# Patient Record
Sex: Female | Born: 1955 | Race: Black or African American | Hispanic: No | Marital: Single | Smoking: Current every day smoker
Health system: Southern US, Community
[De-identification: ages and names within clinical notes are randomized; demographics above are authoritative.]

## PROBLEM LIST (undated history)

## (undated) DIAGNOSIS — I10 Essential (primary) hypertension: Secondary | ICD-10-CM

## (undated) DIAGNOSIS — J449 Chronic obstructive pulmonary disease, unspecified: Secondary | ICD-10-CM

---

## 2004-10-14 ENCOUNTER — Emergency Department: Payer: Self-pay | Admitting: Emergency Medicine

## 2009-08-18 ENCOUNTER — Inpatient Hospital Stay: Payer: Self-pay | Admitting: Internal Medicine

## 2012-07-28 ENCOUNTER — Emergency Department: Payer: Self-pay | Admitting: Emergency Medicine

## 2012-07-28 LAB — CBC
HGB: 12.9 g/dL (ref 12.0–16.0)
MCH: 31.1 pg (ref 26.0–34.0)
MCV: 92 fL (ref 80–100)
Platelet: 329 10*3/uL (ref 150–440)
RBC: 4.16 10*6/uL (ref 3.80–5.20)
RDW: 13.2 % (ref 11.5–14.5)
WBC: 8.3 10*3/uL (ref 3.6–11.0)

## 2012-07-28 LAB — COMPREHENSIVE METABOLIC PANEL
Albumin: 3.5 g/dL (ref 3.4–5.0)
Bilirubin,Total: 0.3 mg/dL (ref 0.2–1.0)
Calcium, Total: 9.1 mg/dL (ref 8.5–10.1)
EGFR (African American): >60
Potassium: 3.2 mmol/L — ABNORMAL LOW (ref 3.5–5.1)
SGOT(AST): 32 U/L (ref 15–37)

## 2012-07-28 LAB — URINALYSIS, COMPLETE
Bilirubin,UR: NEGATIVE
Hyaline Cast: 28
Nitrite: NEGATIVE
Protein: 30
RBC,UR: 18 /HPF (ref 0–5)
Squamous Epithelial: 11
WBC UR: 14 /HPF (ref 0–5)

## 2012-07-31 LAB — URINE CULTURE

## 2013-05-14 DIAGNOSIS — I2510 Atherosclerotic heart disease of native coronary artery without angina pectoris: Secondary | ICD-10-CM | POA: Diagnosis present

## 2013-05-16 ENCOUNTER — Inpatient Hospital Stay: Payer: Self-pay | Admitting: Specialist

## 2013-05-16 LAB — COMPREHENSIVE METABOLIC PANEL
Albumin: 3.7 g/dL (ref 3.4–5.0)
BUN: 15 mg/dL (ref 7–18)
Calcium, Total: 9 mg/dL (ref 8.5–10.1)
Chloride: 109 mmol/L — ABNORMAL HIGH (ref 98–107)
Co2: 26 mmol/L (ref 21–32)
EGFR (African American): >60
Osmolality: 279 (ref 275–301)
Potassium: 3.7 mmol/L (ref 3.5–5.1)
SGOT(AST): 27 U/L (ref 15–37)
Sodium: 139 mmol/L (ref 136–145)
Total Protein: 7.9 g/dL (ref 6.4–8.2)

## 2013-05-16 LAB — CBC
HGB: 13.1 g/dL (ref 12.0–16.0)
MCHC: 32.9 g/dL (ref 32.0–36.0)
MCV: 95 fL (ref 80–100)
RBC: 4.23 10*6/uL (ref 3.80–5.20)
WBC: 7.2 10*3/uL (ref 3.6–11.0)

## 2013-05-16 LAB — TROPONIN I
Troponin-I: 0.07 ng/mL — ABNORMAL HIGH
Troponin-I: 0.06 ng/mL — ABNORMAL HIGH

## 2013-05-16 LAB — RAPID INFLUENZA A&B ANTIGENS

## 2013-05-17 LAB — CBC WITH DIFFERENTIAL/PLATELET
Basophil #: 0 10*3/uL (ref 0.0–0.1)
Basophil %: 0.5 %
Eosinophil #: 0 10*3/uL (ref 0.0–0.7)
HCT: 37.6 % (ref 35.0–47.0)
HGB: 12.7 g/dL (ref 12.0–16.0)
Lymphocyte #: 0.5 10*3/uL — ABNORMAL LOW (ref 1.0–3.6)
MCV: 95 fL (ref 80–100)
Monocyte #: 0.1 x10 3/mm — ABNORMAL LOW (ref 0.2–0.9)
Monocyte %: 0.6 %
Neutrophil #: 8.2 10*3/uL — ABNORMAL HIGH (ref 1.4–6.5)
Neutrophil %: 92.4 %
Platelet: 278 10*3/uL (ref 150–440)

## 2013-05-17 LAB — BASIC METABOLIC PANEL
Chloride: 105 mmol/L (ref 98–107)
Co2: 25 mmol/L (ref 21–32)
Potassium: 4.4 mmol/L (ref 3.5–5.1)
Sodium: 138 mmol/L (ref 136–145)

## 2013-05-21 LAB — CULTURE, BLOOD (SINGLE)

## 2013-10-08 DIAGNOSIS — I10 Essential (primary) hypertension: Secondary | ICD-10-CM

## 2013-10-10 DIAGNOSIS — F1490 Cocaine use, unspecified, uncomplicated: Secondary | ICD-10-CM | POA: Diagnosis present

## 2013-12-24 ENCOUNTER — Inpatient Hospital Stay: Payer: Self-pay | Admitting: Internal Medicine

## 2013-12-24 LAB — CBC
HCT: 42 % (ref 35.0–47.0)
HGB: 14.2 g/dL (ref 12.0–16.0)
MCH: 31.7 pg (ref 26.0–34.0)
MCHC: 33.8 g/dL (ref 32.0–36.0)
MCV: 94 fL (ref 80–100)
Platelet: 271 10*3/uL (ref 150–440)
RBC: 4.47 10*6/uL (ref 3.80–5.20)
RDW: 14.1 % (ref 11.5–14.5)
WBC: 4 10*3/uL (ref 3.6–11.0)

## 2013-12-24 LAB — COMPREHENSIVE METABOLIC PANEL
AST: 20 U/L (ref 15–37)
Albumin: 3.8 g/dL (ref 3.4–5.0)
Alkaline Phosphatase: 76 U/L
Anion Gap: 5 — ABNORMAL LOW (ref 7–16)
BUN: 12 mg/dL (ref 7–18)
Bilirubin,Total: 0.3 mg/dL (ref 0.2–1.0)
CALCIUM: 8.7 mg/dL (ref 8.5–10.1)
CHLORIDE: 111 mmol/L — AB (ref 98–107)
Co2: 25 mmol/L (ref 21–32)
Creatinine: 0.9 mg/dL (ref 0.60–1.30)
EGFR (Non-African Amer.): >60
Glucose: 103 mg/dL — ABNORMAL HIGH (ref 65–99)
OSMOLALITY: 281 (ref 275–301)
POTASSIUM: 3.7 mmol/L (ref 3.5–5.1)
SGPT (ALT): 15 U/L (ref 12–78)
SODIUM: 141 mmol/L (ref 136–145)
TOTAL PROTEIN: 7.7 g/dL (ref 6.4–8.2)

## 2013-12-24 LAB — TROPONIN I
TROPONIN-I: 0.07 ng/mL — AB
Troponin-I: 0.07 ng/mL — ABNORMAL HIGH
Troponin-I: 0.08 ng/mL — ABNORMAL HIGH

## 2013-12-24 LAB — LIPID PANEL
Cholesterol: 197 mg/dL (ref 0–200)
HDL: 41 mg/dL (ref 40–60)
Ldl Cholesterol, Calc: 136 mg/dL — ABNORMAL HIGH (ref 0–100)
Triglycerides: 102 mg/dL (ref 0–200)
VLDL CHOLESTEROL, CALC: 20 mg/dL (ref 5–40)

## 2013-12-24 LAB — CK TOTAL AND CKMB (NOT AT ARMC)
CK, Total: 110 U/L
CK-MB: 1.6 ng/mL (ref 0.5–3.6)

## 2013-12-25 LAB — BASIC METABOLIC PANEL
Anion Gap: 8 (ref 7–16)
BUN: 12 mg/dL (ref 7–18)
CREATININE: 0.9 mg/dL (ref 0.60–1.30)
Calcium, Total: 9.4 mg/dL (ref 8.5–10.1)
Chloride: 109 mmol/L — ABNORMAL HIGH (ref 98–107)
Co2: 23 mmol/L (ref 21–32)
EGFR (African American): >60
GLUCOSE: 157 mg/dL — AB (ref 65–99)
Osmolality: 282 (ref 275–301)
Potassium: 3.9 mmol/L (ref 3.5–5.1)
Sodium: 140 mmol/L (ref 136–145)

## 2013-12-25 LAB — CBC WITH DIFFERENTIAL/PLATELET
Basophil #: 0 10*3/uL (ref 0.0–0.1)
Basophil %: 0.7 %
EOS ABS: 0 10*3/uL (ref 0.0–0.7)
Eosinophil %: 0 %
HCT: 39.4 % (ref 35.0–47.0)
HGB: 12.9 g/dL (ref 12.0–16.0)
LYMPHS PCT: 15 %
Lymphocyte #: 0.9 10*3/uL — ABNORMAL LOW (ref 1.0–3.6)
MCH: 31.3 pg (ref 26.0–34.0)
MCHC: 32.7 g/dL (ref 32.0–36.0)
MCV: 96 fL (ref 80–100)
MONOS PCT: 4.8 %
Monocyte #: 0.3 x10 3/mm (ref 0.2–0.9)
Neutrophil #: 4.6 10*3/uL (ref 1.4–6.5)
Neutrophil %: 79.5 %
Platelet: 255 10*3/uL (ref 150–440)
RBC: 4.12 10*6/uL (ref 3.80–5.20)
RDW: 13.8 % (ref 11.5–14.5)
WBC: 5.7 10*3/uL (ref 3.6–11.0)

## 2013-12-25 LAB — HEMOGLOBIN A1C: Hemoglobin A1C: 6 % (ref 4.2–6.3)

## 2013-12-26 ENCOUNTER — Other Ambulatory Visit: Payer: Self-pay | Admitting: Physician Assistant

## 2013-12-26 DIAGNOSIS — R748 Abnormal levels of other serum enzymes: Secondary | ICD-10-CM

## 2013-12-26 DIAGNOSIS — I10 Essential (primary) hypertension: Secondary | ICD-10-CM

## 2013-12-26 DIAGNOSIS — J441 Chronic obstructive pulmonary disease with (acute) exacerbation: Secondary | ICD-10-CM

## 2013-12-29 LAB — PLATELET COUNT: Platelet: 259 10*3/uL (ref 150–440)

## 2014-05-08 ENCOUNTER — Emergency Department: Payer: Self-pay | Admitting: Emergency Medicine

## 2014-05-08 LAB — CBC
HCT: 40.1 % (ref 35.0–47.0)
HGB: 13 g/dL (ref 12.0–16.0)
MCH: 31.2 pg (ref 26.0–34.0)
MCHC: 32.5 g/dL (ref 32.0–36.0)
MCV: 96 fL (ref 80–100)
Platelet: 297 10*3/uL (ref 150–440)
RBC: 4.18 10*6/uL (ref 3.80–5.20)
RDW: 13.2 % (ref 11.5–14.5)
WBC: 6.6 10*3/uL (ref 3.6–11.0)

## 2014-05-08 LAB — URINALYSIS, COMPLETE
BILIRUBIN, UR: NEGATIVE
Bacteria: NONE SEEN
GLUCOSE, UR: NEGATIVE mg/dL (ref 0–75)
Ketone: NEGATIVE
Nitrite: NEGATIVE
Ph: 5 (ref 4.5–8.0)
Protein: NEGATIVE
RBC,UR: 15 /HPF (ref 0–5)
SPECIFIC GRAVITY: 1.03 (ref 1.003–1.030)

## 2014-05-08 LAB — COMPREHENSIVE METABOLIC PANEL
ALBUMIN: 3.6 g/dL (ref 3.4–5.0)
ALK PHOS: 70 U/L
ALT: 16 U/L
ANION GAP: 5 — AB (ref 7–16)
BILIRUBIN TOTAL: 0.2 mg/dL (ref 0.2–1.0)
BUN: 19 mg/dL — ABNORMAL HIGH (ref 7–18)
CHLORIDE: 110 mmol/L — AB (ref 98–107)
CO2: 24 mmol/L (ref 21–32)
CREATININE: 0.92 mg/dL (ref 0.60–1.30)
Calcium, Total: 9 mg/dL (ref 8.5–10.1)
EGFR (African American): >60
EGFR (Non-African Amer.): >60
GLUCOSE: 103 mg/dL — AB (ref 65–99)
Osmolality: 280 (ref 275–301)
Potassium: 3.5 mmol/L (ref 3.5–5.1)
SGOT(AST): 6 U/L — ABNORMAL LOW (ref 15–37)
Sodium: 139 mmol/L (ref 136–145)
TOTAL PROTEIN: 7.3 g/dL (ref 6.4–8.2)

## 2014-05-08 LAB — D-DIMER(ARMC): D-Dimer: 462 ng/ml

## 2014-05-08 LAB — TROPONIN I
TROPONIN-I: 0.06 ng/mL — AB
Troponin-I: 0.05 ng/mL

## 2014-08-07 ENCOUNTER — Ambulatory Visit: Payer: Self-pay | Admitting: Nurse Practitioner

## 2014-09-01 ENCOUNTER — Ambulatory Visit: Payer: Self-pay | Admitting: Family Medicine

## 2014-09-02 ENCOUNTER — Ambulatory Visit: Payer: Self-pay | Admitting: Nurse Practitioner

## 2014-10-02 NOTE — H&P (Signed)
PATIENT NAME:  Kristi Adams, Kristi Adams MR#:  [id number] DATE OF BIRTH:  [birth year]  DATE OF ADMISSION:  05/16/2013  PRIMARY CARE PHYSICIAN: Dr. Angelique HolmEdward Lance.   REQUESTING PHYSICIAN: Dr. Jene Everyobert Kinner.   CHIEF COMPLAINT: Shortness of breath, cough.  HISTORY OF PRESENT ILLNESS: The patient is a 59 year old female with a known history of hypertension and COPD, who is being admitted for acute respiratory failure, likely due to COPD exacerbation. The patient has had a cough and congestion for the last 2 to 3 days associated with wheezing and coughing, She was also using accessory muscles of respiration, could not control at home and decided to come to the Emergency Department. While in the ED, she was hypoxic with room air oxygen saturation of 88% and is being admitted for further evaluation and management. She also complains of headache. Denies any fever. Has some chest tightness. She has ongoing coughing. She was breathing 26 times per minute and is being admitted for further evaluation and management.   PAST MEDICAL HISTORY: COPD with ongoing smoking, hypertension, hyperlipidemia.   ALLERGIES: No known drug allergies.   CURRENT MEDICATIONS AT HOME: 1.  Amlodipine/valsartan 5/160 mg p.o. daily. 2.  Aspirin 81 mg p.o. daily.  3.  Crestor 10 mg p.o. at bedtime.  4.  Nitroglycerin 0.4 mg sublingual every 5 minutes, up to 3 doses as needed.  5.  Proventil 2 puffs inhaled 4 times a day as needed.  6.  Symbicort 2 puffs inhaled twice a day.   SOCIAL HISTORY: Smokes 1 pack of cigarettes daily for the last 40 to 50 years. Occasional alcohol. No IV drugs of abuse.   FAMILY HISTORY: Positive for hypertension.   REVIEW OF SYSTEMS:   CONSTITUTIONAL: No fever. Positive for fatigue and weakness.  EYES: No blurred or double vision.  ENT: No tinnitus or ear pain.  RESPIRATORY: Positive for cough with clear mucus. Wheezing present. Dyspnea, mainly on exertion but also at rest. History of COPD with ongoing  smoking.  CARDIOVASCULAR: Chest tightness from constant coughing. No orthopnea or edema.  GASTROINTESTINAL: No nausea, vomiting, diarrhea.  GENITOURINARY: No dysuria or hematuria. ENDOCRINE: No polyuria or nocturia. HEMATOLOGIC: No anemia or easy bruising. SKIN: No rash or lesion.  MUSCULOSKELETAL: No arthritis or muscle cramp.  NEUROLOGIC: No tingling, numbness, weakness.  PSYCHIATRIC: No history of anxiety or depression.   PHYSICAL EXAMINATION: VITAL SIGNS: Temperature 98.9, heart rate 99 per minute, respiration 26 to 28 per minute, blood pressure 149/83 mmHg. She is saturating 88% on room air and 91% on 2 liters of oxygen via nasal cannula. GENERAL: The patient is a 59 year old female lying in the bed in acute respiratory distress.  EYES: Pupils equal, round, reactive to light and accommodation. No scleral icterus. Extraocular muscles intact.  HENT: Atraumatic, normocephalic. Oropharynx and nasopharynx clear.  NECK: Supple. No jugular venous distention. No thyroid enlargement or tenderness. LUNGS: Wheezing throughout both lungs with prolonged expiration, using accessory muscles of respiration. There is some labored breathing and decreased breath sounds at the bases. No rales or rhonchi.  CARDIOVASCULAR: S1, S2 normal, tachycardic. No murmur, rubs, or gallop.  ABDOMEN: Soft, nontender, nondistended. Bowel sounds present. No organomegaly or masses. EXTREMITIES: No pedal edema, cyanosis, or clubbing.  NEUROLOGIC: Nonfocal examination. Cranial nerves II through XII intact. Muscle strength 5/5 in all extremities. Sensation intact.  PSYCHIATRIC: The patient is alert and oriented x 3.  SKIN: No obvious rash, lesion, ulcer.   LABORATORY, DIAGNOSTIC AND RADIOLOGICAL DATA: Normal BMP. Normal liver function tests. Troponin  of 0.07. Normal CBC. Negative influenza test.   Chest x-ray in the ED showed no acute cardiopulmonary disease.   EKG shows normal sinus rhythm, LVH. She does have some flipped  T waves in lateral leads. No major other ST-T changes.   IMPRESSION AND PLAN: 1.  Acute respiratory failure: Likely due to chronic obstructive pulmonary disease exacerbation. Will start her on IV steroids, antibiotics, nebulizer breathing treatment. Continue Symbicort. Add Spiriva.  2.  Hypertension: Will continue home medication.  3.  Elevated troponin: Likely due to supply-demand ischemia with respiratory failure. Will monitor 2 more sets of troponin. Off-unit telemetry.  4.  Hyperlipidemia: Will continue statin.  5.  Tobacco abuse: She was counseled for about 3 minutes. She does request nicotine patch, which we will order, 21 mg as she smokes about 1 pack of cigarettes daily. She denies any thought of quitting anytime soon.   Total time taking care of this patient: 55 minutes.   ____________________________ Ellamae Sia. Sherryll Burger, MD vss:jcm D: 05/16/2013 20:29:12 ET T: 05/16/2013 21:18:38 ET JOB#: [id number]  cc: Lenette Rau S. Sherryll Burger, MD, <Dictator> Dr. Angelique Holm Ellamae Sia [facility] MD ELECTRONICALLY SIGNED 05/19/2013 15:28

## 2014-10-02 NOTE — Discharge Summary (Signed)
PATIENT NAME:  Kristi Adams, Kristi Adams MR#:  [id number] DATE OF BIRTH:  01-09-1956  DATE OF ADMISSION:  05/16/2013 DATE OF DISCHARGE:  05/20/2013  For a detailed note, please take a look at the history and physical on admission by Dr. Delfino LovettVipul Shah.   DIAGNOSES AT DISCHARGE ARE AS FOLLOWS: Acute respiratory failure, likely secondary to chronic obstructive pulmonary disease exacerbation; chronic obstructive pulmonary disease exacerbation likely secondary to ongoing tobacco abuse, hypertension, tobacco abuse.   DISCHARGE DIET:  The patient is being discharged on a low-sodium diet.   DISCHARGE ACTIVITY: As tolerated.   DISCHARGE FOLLOWUP: With Dr. Lorin PicketEd Lance in the next 1 to 2 weeks.   DISCHARGE MEDICATIONS:  Aspirin 81 mg daily, amlodipine/valsartan 5/160 one tab daily, nitroglycerin sublingual as needed, Crestor 10 mg at bedtime, Symbicort 2 puffs b.i.d., albuterol inhaler 2 puffs 4 times daily as needed, calcium with vitamin D 2000 mg daily, Spiriva 1 puff daily, Levaquin 5 mg daily x 5 days and a prednisone taper starting at 50 mg down to 10 mg over the next 10 days.   PERTINENT STUDIES DONE DURING THE HOSPITAL COURSE: Are as follows: A chest x-ray done on admission showing no acute cardiopulmonary disease. A repeat chest x-ray done on December 6th showing no evidence of pneumonia or CHF.    HOSPITAL COURSE: This is a 59 year old female with medical problems as mentioned above, presented to the hospital with shortness of breath, cough and wheezing and noted to be in acute respiratory failure, likely secondary to COPD exacerbation.  1.  Acute respiratory failure. This was likely secondary to COPD exacerbation. The patient was treated aggressively with IV steroids, around-the-clock nebulizer treatments, also maintained on her Spiriva and Symbicort. She was also given Pulmicort nebs and started on empiric antibiotics. After aggressive therapy, the patient's clinical symptoms have significantly improved. She was  also ambulated on room air, did not desaturate below 91%; therefore, did not qualify for home oxygen. She currently is being discharged on a long prednisone taper along with empiric Levaquin as stated, with maintenance of her inhalers. She was also strongly advised from quitting smoking.  2.  Hypertension. The patient remained hemodynamically stable on her Norvasc and valsartan and she will resume that.  3.  Elevated troponin. The patient had a mildly elevated troponin at 0.06. This was likely related to demand ischemia from hypoxemia and respiratory failure. There was no evidence of acute coronary syndrome. She is currently chest pain-free and hemodynamically stable.  4.  Hyperlipidemia. The patient was maintained on her Crestor. She will resume that.  5.  Tobacco abuse. The patient was maintained on nicotine patch and strongly advised to quit smoking.   The patient is a full code.   TIME SPENT: 40 minutes.   ____________________________ Rolly PancakeVivek J. Cherlynn KaiserSainani, MD vjs:cs D: 05/20/2013 15:06:00 ET T: 05/20/2013 15:50:58 ET JOB#: [id number]  cc: Rolly PancakeVivek J. Cherlynn KaiserSainani, MD, <Dictator> Angelique HolmEdward Lance Houston SirenVIVEK J SAINANI MD ELECTRONICALLY SIGNED 05/22/2013 14:32

## 2014-10-03 NOTE — Discharge Summary (Signed)
PATIENT NAME:  Kristi Adams, Ravneet G MR#:  [id number] DATE OF BIRTH:  1955-10-24  DATE OF ADMISSION:  12/24/2013 DATE OF DISCHARGE:  12/29/2013  DISCHARGE DIAGNOSES:  1. Acute respiratory failure.  2. Chronic obstructive pulmonary disease exacerbation.  3. Hypertension.  4. Tobacco abuse.  5. Coronary artery disease.  6. Elevated troponin, mild secondary to demand ischemia from chronic obstructive pulmonary disease.   IMAGING STUDIES: Chest x-ray, PA and lateral, which showed no acute infiltrates but did suggest bronchitis.   The echocardiogram showed ejection fraction of 75% with normal systolic function. No significant valvular abnormalities.   ADMITTING HISTORY AND PHYSICAL: Please see detailed H and P dictated by Dr. Elisabeth PigeonVachhani. In brief, a 59 year old African American female patient with history of hypertension, hyperlipidemia, CAD, and COPD who continues to smoke, presented to the Emergency Room complaining of shortness of breath and cough. The patient was found to have significant wheezing needing oxygen to keep her saturations up and was admitted to the hospitalist service.   HOSPITAL COURSE:  1. Acute respiratory failure secondary to COPD exacerbation. The patient was started on IV steroids, scheduled nebulizers, and oxygen support. She did improve well, was also on Levaquin during the hospital stay. The patient by the day of discharge does have wheezing and shortness of breath on exertion, but has not desatted on ambulation. The patient has requested that she be discharged home. She mentions that she always has shortness of breath and wheezing and feels like she is back to baseline and would be fine at home. I have counseled her to quit smoking on multiple occasions during the stay in the hospital. Presently, she is not needing any oxygen with saturations of 94% on ambulation on room air.  2. Elevated troponin very minimal with 0.07, 0.08, and 0.07. She had an echocardiogram which showed no  wall motion abnormalities. She did not have any chest pain.  3. Hypertension, stable during the hospital stay.  4. Prior to discharge, the patient has good air entry on both sides but has expiratory wheezes. No use of accessory muscles. Heart sounds S1, S2 without any murmurs. No edema.   DISCHARGE MEDICATIONS:  1. Prednisone 60 mg tapered over 12 days.  2. Aspirin 81 mg daily.  3. Isosorbide mononitrate 30 mg oral once a day.  4. Crestor 10 mg oral once a day.  5. Exforge 5/160 mg oral once a day.  6. Spiriva 18 mcg inhaled once a day.  7. Symbicort 160/4.5 mg 2 puffs inhaled 2 times a day.  8. Cholecalciferol 2000 international units weekly.  9. Proventil 2 puffs inhaled every 4 hours as needed for shortness of breath or wheezing.  10. Tussionex 5 mg oral every 12 hours as needed for cough.  11. Nicotine 14 mg transdermal patch once a day.  12. DuoNeb 3 mL inhaled 4 times a day.  13. Levaquin 5 mg oral once a day for 5 days.   DISCHARGE INSTRUCTIONS: Low-sodium diet. Activity as tolerated. Follow up with primary care physician within a week. The patient has been counseled to quit smoking.   TIME SPENT ON DAY OF DISCHARGE IN DISCHARGE ACTIVITY: 40 minutes.   ____________________________ Molinda BailiffSrikar R. Messi Twedt, MD srs:lt D: 12/29/2013 12:57:44 ET T: 12/29/2013 21:47:42 ET JOB#: [id number]  cc: Wardell HeathSrikar R. Elpidio AnisSudini, MD, <Dictator> Angelique HolmEdward Lance, MD Orie FishermanSRIKAR R Latiya Navia MD ELECTRONICALLY SIGNED 12/31/2013 17:23

## 2014-10-03 NOTE — H&P (Signed)
PATIENT NAME:  Kristi Adams, Kristi Adams MR#:  [id number] DATE OF BIRTH:  Mar 06, 1956  DATE OF ADMISSION:  12/24/2013  PRIMARY CARE PHYSICIAN: With [facility].  REFERRING EMERGENCY ROOM PHYSICIAN: Dr. Onalee Huaavid.   CHIEF COMPLAINT: Shortness of breath and cough.   HISTORY OF PRESENT ILLNESS: A 59 year old female with past history of COPD, with ongoing smoking, hypertension, hyperlipidemia and coronary artery disease, suggested to have medical management. For the last 2 days, she has cough and shortness of breath, so decided to come to Emergency Room. On further questioning, she said that she does not have sputum production or fever, but feels lightheaded and has started feeling chest pain in lower chest, bilateral, with constant coughing. She called [facility], and while she was on the phone, they heard severe coughing and wheezing, so they said to go to Emergency Room instead of coming over there, as they might also send her to Emergency Room directly. The pain in the lower chest she described as sharp, but mild, 4 to 5 out of 10, and only with coughing. 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   REVIEW OF SYSTEMS:  CONSTITUTIONAL: Negative for fever, fatigue, weakness, pain or weight loss.  EYES: No blurry or double vision, discharge or redness.  ENT: No tinnitus, ear pain or hearing loss.  RESPIRATORY: The patient has cough and wheezing. No hemoptysis, but has shortness of breath.  CARDIOVASCULAR: No chest pain, orthopnea, edema, arrhythmia, palpitation.  GASTROINTESTINAL: No nausea, vomiting, diarrhea, abdominal pain.  GENITOURINARY: No dysuria, hematuria or increased frequency.  ENDOCRINE: No heat or cold intolerance. No excessive sweating.  SKIN: No acne, rashes or lesions.  MUSCULOSKELETAL: No pain or  swelling in the joints.  NEUROLOGICAL: No numbness, weakness, tremor or vertigo.  PSYCHIATRIC: No anxiety, insomnia, bipolar disorder.   PAST MEDICAL HISTORY:  1. COPD with ongoing smoking.  2. Hypertension.  3. Hyperlipidemia.  4. Coronary artery disease, found having 2 blocked arteries, but was not big enough to do stenting, so suggested to have medications.   SOCIAL HISTORY: She smoked 1 pack of cigarettes daily for the last 40 to 50 years. She tried quitting 2 to 3 times, but was not successful, picked up again due to stress. Denies alcohol or illegal drug use. She lives with her son. 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   FAMILY HISTORY: Positive for hypertension.   HOME MEDICATIONS: Spiriva 18 mcg inhalation once a day and aspirin enteric-coated 81 mg once a day, but she is currently not taking for the last 2 months.   PHYSICAL EXAMINATION: VITAL SIGNS: In ER, temperature 98.2, pulse is 93, respirations 24, blood pressure 158/88, pulse oximetry is 91 on room air.  GENERAL: The patient is fully alert and oriented to time, place and person. Does not appear in any acute distress.  HEENT: Head and neck atraumatic. Conjunctivae pink. Oral mucosa moist.  NECK: Supple. No JVD.  RESPIRATORY: Bilateral equal air entry present, but expiratory wheezing present.  CARDIOVASCULAR: S1, S2 present, regular. No murmur.  ABDOMEN: Soft, nontender. Bowel sounds present. No organomegaly.  SKIN: No rashes.  LEGS: No edema.  NEUROLOGICAL: Power 5 out of 5. Moves all 4 limbs. No tremor or  rigidity. No numbness. Follows commands.  PSYCHIATRIC: Does not appear in any acute psychiatric illness.  JOINTS: No swelling or tenderness.   IMPORTANT LABORATORY RESULTS:  Glucose 103, BUN 12, creatinine 0.9, sodium 141, potassium is 3.7, chloride 111, CO2 25, calcium 8.7.  Troponin 0.07.  Total protein 7.7, albumin 3.8,  bilirubin 0.3, alkaline phosphatase 76, SGOT 20, SGPT 15. WBC 4, hemoglobin 14.2, platelets 271 and MCV is 94. Chest x-ray, PA and lateral: New central airway thickening and prominence of bronchovascular markings, suggestive of bronchitis. No evidence of pneumonia. EKG: Sinus rhythm, shows some T wave inversion in lateral chest leads, but which is old compared to previous EKG.   ASSESSMENT AND PLAN: A 59 year old female with history of chronic obstructive pulmonary disease, current smoking, hypertension, hyperlipidemia and coronary artery disease, suggested medical management, who came to Emergency Room with cough and shortness of breath for the last 2 days and did not improve enough after getting multiple breathing treatments in ER.   1. Chronic obstructive pulmonary disease exacerbation. Will give her IV Solu-Medrol, Spiriva, DuoNeb and antibiotics, Rocephin and azithromycin, and give oxygen as-needed basis. As she was not able to afford her medication because of financial reasons, will get case management to come and work with her. I also had a discussion with her about $4 plan medications, and she appreciates that help and information.  2. Hypertension. Currently, during my examination, blood pressure 134/74, so she does not need any medication at this time, but we might need to start on some generic medication if it goes up.  3. Hyperlipidemia. She was not taking any medication for that. Will check lipid panel and start on medication if needed.  4. Coronary artery disease- with elevated troponin. Started on aspirin. She should be taking statin, we are checking a lipid panel, and beta blocker if needed for her high blood pressure.    troponin is likley due to COPD exacerbation- will folow serial and keep on tele monitoring. 5. Smoking. Smoking cessation counseling is done for 5 minutes, and she agreed to try nicotine patch.   CODE STATUS: Full code.   TOTAL TIME SPENT ON THIS ADMISSION: 50  minutes.   ____________________________ Kristi Adams Elisabeth Pigeon, MD vgv:lb D: 12/24/2013 12:34:31 ET T: 12/24/2013 12:56:29 ET JOB#: [id number]  cc: Kristi Adams. Elisabeth Pigeon, MD, <Dictator> Altamese Dilling MD ELECTRONICALLY SIGNED 12/25/2013 12:58

## 2014-10-03 NOTE — Consult Note (Signed)
General Aspect Primary Cardiologist: [facility]  59 y/o F with h/o COPD with on going tobacco use, HTN, hyperlipidemia, CAD (followed by [facility]), cath 04/21/2013 with non obstructive disease who developed cough, SOB, lightheadedness, and associated chest pain with coughing for 2 days. Her initial troponin was 0.07->0.07->0.08 on 12/24/2013. EKG with NSR, 100, old TWI V2-V6. We are consulted for elevated troponin in patient with respiratory distress.   ________________________________________  Cath 04/21/13: The left main (LMCA)  is a large-caliber  short vessel  that originatesfrom the left coronary  sinus. It bifurcates  into the  left anteriordescending (LAD)  and left circumflex  (LCx) arteries.  There is noangiographic evidence  of significant  disease in the  LMCA.The LAD is a large-caliber  vessel  that gives off one  medium-sizeddiagonal (D) branch  before it wraps  around the apex.  There are mildluminal irregularities  in the mid  LAD.The LCx is a large-caliber  vessel  that gives off two  obtuse marginal(OM) branches and  then continues as  a small vessel  in the AV groove.OM1 is a large bifurcated  vessel.  OM2 is a medium caliber  vessel.There is no angiographic  evidence  of significant disease  in the LCx.The right coronary  artery (RCA) is  a large-caliber  vesseloriginating from  the right coronary  sinus. It continues  distally into the posterior  descending artery  (PDA) consistent  with a rightdominant system.  There is no angiographic  evidence  of significant disease in the RCA.   Present Illness 59 y/o F with the above problem list presented to [facility] 12/24/2013 with 2 day history of coughing, SOB, lightheadedness, and chest pain associated with her coughing. She does have a reported history of nonobstructive CAD from a cath done at [facility] 04/21/2013 (see above for full report from [facility].) She reports "2 blockages" but no stents. Medical therapy was advised. She was started on Imdur 30 mg daily, crestor  69m, asa 81 mg, exforge 5-160 mg, and given SL Nitro rx. She has done well on these medications and been without chest pain since this episode in November. However, she does have significant SOB/DOE and deconditiong at baseline, stating she must take frequent breaks with ambulation 2/2 to SOB. She does not develop chest pain associated with this exertion.   [facility], as she has smoked 1 ppd for 40-50 yrs. Over the past 2 days its has gotten worse. Cough is associated with chest pain was along the bilateral interior portion of her rib cage and at times persisted after she was done coughing. Pain sharp and rated a 5/10. Baseline SOB and DOE. Not taking her spiriva.  Echo was done on 12/26/2013 revealing: 1. Left ventricular ejection fraction, by visual estimation, is >75%.  2. Normal global left ventricular systolic function. 3. Mildly dilated left atrium.   Physical Exam:  GEN well developed, well nourished, no acute distress   HEENT PERRL   NECK supple   RESP normal resp effort  wheezing   CARD Regular rate and rhythm  No murmur   ABD denies tenderness  soft  normal BS   EXTR negative edema   SKIN normal to palpation   NEURO cranial nerves intact   PSYCH alert, A+O to time, place, person, good insight   Review of Systems:  General: No Complaints   Skin: No Complaints   ENT: No Complaints   Eyes: No Complaints  Neck: No Complaints   Respiratory: Frequent cough  Short of breath  Wheezing   Cardiovascular: Chest pain or discomfort  Tightness  Dyspnea   Gastrointestinal: No Complaints   Genitourinary: No Complaints   Vascular: No Complaints   Musculoskeletal: No Complaints   Neurologic: No Complaints   Hematologic: No Complaints   Endocrine: No Complaints   Psychiatric: No Complaints   Review of Systems: All other systems were reviewed and found to be negative   Medications/Allergies Reviewed  Medications/Allergies reviewed   Family & Social History:  Family and Social History:  Family History Coronary Artery Disease   Social History positive  tobacco, negative ETOH, negative Illicit drugs, Continues to smoke 1 ppd x 40-50 years. Has tried to quit.   Place of Living Home  Lives in [place]. Unemployed.     COPD:    uti:    URI:    pneumonia:    asthma:    Bronchitis:   Home Medications: Medication Instructions Status  Spiriva 18 mcg inhalation capsule 1 each inhaled once a day Active  Aspirin Enteric Coated 81 mg oral delayed release tablet 1 tab(s) orally once a day Active   Lab Results:  LabObservation:  17-Jul-15 09:00   OBSERVATION Reason for Test  Hepatic:  15-Jul-15 09:17   Bilirubin, Total 0.3  Alkaline Phosphatase 76 (45-117 NOTE: New Reference Range 05/02/13)  SGPT (ALT) 15  SGOT (AST) 20  Total Protein, Serum 7.7  Albumin, Serum 3.8  Cardiology:  15-Jul-15 09:04   Ventricular Rate 100  Atrial Rate 100  P-R Interval 152  QRS Duration 72  QT 370  QTc 477  P Axis 81  R Axis 60  T Axis 81  ECG interpretation Normal sinus rhythm Biatrial enlargement Left ventricular hypertrophy with repolarization abnormality Cannot rule out Septal infarct , age undetermined Abnormal ECG When compared with ECG of 16-May-2013 18:32, No significant change was found ----------unconfirmed---------- Confirmed by OVERREAD, NOT ([id number]), editor PEARSON, BARBARA ([id number]) on 12/25/2013 8:18:29 AM  17-Jul-15 09:00   Echo Doppler REASON FOR EXAM:     COMMENTS:     PROCEDURE: [facility] - ECHO DOPPLER COMPLETE(TRANSTHOR)  - Dec 26 2013  9:00AM   RESULT: Echocardiogram Report  Patient Name:   Kristi Adams Date of Exam: 12/26/2013 Medical Rec #:  [id number]          Custom1: Date of Birth:  1955/10/09        Height:       59.0 in Patient Age:    59 years        Weight:       176.0 lb Patient Gender: F               BSA:          1.75 m??  Indications: SOB Sonographer:     Janalee Dane RCS Referring Phys: Vaughan Basta  Summary:  1. Left ventricular ejection fraction, by visual estimation, is >75%.  2. Normal global left ventricular systolic function.  3. Mildly dilated left atrium. 2D AND M-MODE MEASUREMENTS (normal ranges within parentheses): Left Ventricle:          Normal IVSd (2D):      1.10 cm (0.7-1.1) LVPWd (2D):     1.05 cm (0.7-1.1) Aorta/LA:                  Normal LVIDd (2D):     4.51 cm (3.4-5.7) Aortic Root (2D): 3.20 cm (2.4-3.7) LVIDs (2D):  2.24 cm           Left Atrium (2D): 4.20 cm (1.9-4.0) LV FS (2D):     50.3 %   (>25%) LV EF (2D):     81.8 %   (>50%)                                   Right Ventricle:                                   RVd (2D):        0.16 cm LV DIASTOLIC FUNCTION: MV Peak E: 0.75 m/s E/e' Ratio: 13.00 MV Peak A: 0.90 m/s Decel Time: 327 msec E/A Ratio: 0.84 SPECTRAL DOPPLER ANALYSIS (where applicable): Mitral Valve: MV P1/2 Time: 94.83 msec MV Area, PHT: 2.32 cm??  PHYSICIAN INTERPRETATION: Left Ventricle: The left ventricular internal cavity size was normal. LV  septal wall thickness was normal. LV posterior wall thickness was normal.  Global LV systolic function was normal. Left ventricular ejection  fraction, by visual estimation, is >75%. Right Ventricle: The right ventricular size is normal. Global RV systolic  function is normal. Left Atrium: The left atrium is mildly dilated. Mitral Valve: The mitral valve is not well seen. Tricuspid Valve: The tricuspid valve is not well seen. Aortic Valve: The aortic valve was not well seen. No evidence of aortic  valve regurgitation is seen.  [place] MD Electronically signed by [id number] Bill MD Signature Date/Time: 12/26/2013/11:40:21 AM  *** Final ***  IMPRESSION: .  Verified By: Teodoro Spray, M.D., MD  Routine Chem:  15-Jul-15 09:17   Glucose, Serum  103  BUN 12  Creatinine (comp) 0.90  Sodium, Serum 141  Potassium,  Serum 3.7  Chloride, Serum  111  CO2, Serum 25  Calcium (Total), Serum 8.7  Anion Gap  5  Osmolality (calc) 281  eGFR (African American) >60  eGFR (Non-African American) >60 (eGFR values <60m/min/1.73 m2 may be an indication of chronic kidney disease (CKD). Calculated eGFR is useful in patients with stable renal function. The eGFR calculation will not be reliable in acutely ill patients when serum creatinine is changing rapidly. It is not useful in  patients on dialysis. The eGFR calculation may not be applicable to patients at the low and high extremes of body sizes, pregnant women, and vegetarians.)  Result Comment TROPONIN - RESULTS VERIFIED BY REPEAT TESTING.  - C/KATHY COX AT 1021 12/24/13-DAS  - READ-BACK PROCESS PERFORMED.  Result(s) reported on 24 Dec 2013 at 10:25AM.  Cholesterol, Serum 197  Triglycerides, Serum 102  HDL (INHOUSE) 41  VLDL Cholesterol Calculated 20  LDL Cholesterol Calculated  136 (Result(s) reported on 24 Dec 2013 at 01:15PM.)    13:36   Result Comment TROPONIN - RESULTS VERIFIED BY REPEAT TESTING.  - RESULT CALLED PREVIOUSLY AT 1021  - 12/24/13 BY DAS  Result(s) reported on 24 Dec 2013 at 02:37PM.    17:23   Result Comment TROPONIN - PREVIOUSLY CALLED TO KATHY COX AT 1021  - ON 12/24/13 BY DAS. [place] Result(s) reported on 24 Dec 2013 at 06:15PM.  16-Jul-15 04:39   Glucose, Serum  157  BUN 12  Creatinine (comp) 0.90  Sodium, Serum 140  Potassium, Serum 3.9  Chloride, Serum  109  CO2, Serum 23  Calcium (Total), Serum 9.4  Anion Gap  8  Osmolality (calc) 282  eGFR (African American) >60  eGFR (Non-African American) >60 (eGFR values <41m/min/1.73 m2 may be an indication of chronic kidney disease (CKD). Calculated eGFR is useful in patients with stable renal function. The eGFR calculation will not be reliable in acutely ill patients when serum creatinine is changing rapidly. It is not useful in  patients on dialysis. The eGFR calculation may not  be applicable to patients at the low and high extremes of body sizes, pregnant women, and vegetarians.)  Hemoglobin A1c ([facility]) 6.0 (The American Diabetes Association recommends that a primary goal of therapy should be <7% and that physicians should reevaluate the treatment regimen in patients with HbA1c values consistently >8%.)  Cardiac:  15-Jul-15 09:17   Troponin I  0.07 (0.00-0.05 0.05 ng/mL or less: NEGATIVE  Repeat testing in 3-6 hrs  if clinically indicated. >0.05 ng/mL: POTENTIAL  MYOCARDIAL INJURY. Repeat  testing in 3-6 hrs if  clinically indicated. NOTE: An increase or decrease  of 30% or more on serial  testing suggests a  clinically important change)  CK, Total 110 (26-192 NOTE: NEW REFERENCE RANGE  07/14/2013)  CPK-MB, Serum 1.6 (Result(s) reported on 24 Dec 2013 at 10:17AM.)    13:36   Troponin I  0.07 (0.00-0.05 0.05 ng/mL or less: NEGATIVE  Repeat testing in 3-6 hrs  if clinically indicated. >0.05 ng/mL: POTENTIAL  MYOCARDIAL INJURY. Repeat  testing in 3-6 hrs if  clinically indicated. NOTE: An increase or decrease  of 30% or more on serial  testing suggests a  clinically important change)    17:23   Troponin I  0.08 (0.00-0.05 0.05 ng/mL or less: NEGATIVE  Repeat testing in 3-6 hrs  if clinically indicated. >0.05 ng/mL: POTENTIAL  MYOCARDIAL INJURY. Repeat  testing in 3-6 hrs if  clinically indicated. NOTE: An increase or decrease  of 30% or more on serial  testing suggests a  clinically important change)  Routine Hem:  15-Jul-15 09:17   WBC (CBC) 4.0  RBC (CBC) 4.47  Hemoglobin (CBC) 14.2  Hematocrit (CBC) 42.0  Platelet Count (CBC) 271 (Result(s) reported on 24 Dec 2013 at 10:22AM.)  MCV 94  MCH 31.7  MCHC 33.8  RDW 14.1  16-Jul-15 04:39   WBC (CBC) 5.7  RBC (CBC) 4.12  Hemoglobin (CBC) 12.9  Hematocrit (CBC) 39.4  Platelet Count (CBC) 255  MCV 96  MCH 31.3  MCHC 32.7  RDW 13.8  Neutrophil % 79.5  Lymphocyte % 15.0  Monocyte  % 4.8  Eosinophil % 0.0  Basophil % 0.7  Neutrophil # 4.6  Lymphocyte #  0.9  Monocyte # 0.3  Eosinophil # 0.0  Basophil # 0.0 (Result(s) reported on 25 Dec 2013 at 05:07AM.)   EKG:  EKG Interp. by me   Interpretation NSR, 100, old TWI V2-V6.   Radiology Results: XRay:    15-Jul-15 09:49, Chest PA and Lateral  Chest PA and Lateral   REASON FOR EXAM:    Shortness of Breath  COMMENTS:   May transport without cardiac monitor    PROCEDURE: DXR - DXR CHEST PA (OR AP) AND LATERAL  - Dec 24 2013  9:49AM     CLINICAL DATA:  Shortness of breath. Coughing with chest pain.  History of smoking and asthma.    EXAM:  CHEST  2 VIEW    COMPARISON:  05/17/2013 and 05/16/2013.    FINDINGS:  The heart size and mediastinal contours are stable. The pulmonary  vascularity remains normal, although there is  new increased  prominence of the bronchovascular markings, likely representing  bronchitis. There is no focal airspace disease or pleural effusion.  The osseous structures appear stable.     IMPRESSION:  New central airway thickening and prominence of the bronchovascular  markings suggestingbronchitis. No evidence of pneumonia.      Electronically Signed    By: Camie Patience M.D.    On: 12/24/2013 09:53       Verified By: Vivia Ewing, M.D.,  Cardiology:    17-Jul-15 09:00, Echo Doppler  Echo Doppler   REASON FOR EXAM:      COMMENTS:       PROCEDURE: [facility] - ECHO DOPPLER COMPLETE(TRANSTHOR)  - Dec 26 2013  9:00AM     RESULT: Echocardiogram Report    Patient Name:   Kristi Adams Date of Exam: 12/26/2013  Medical Rec #:  [id number]          Custom1:  Date of Birth:  05-Jan-1956        Height:       59.0 in  Patient Age:    53 years        Weight:       176.0 lb  Patient Gender: F               BSA:          1.75 m??    Indications: SOB  Sonographer:    Janalee Dane RCS  Referring Phys: Vaughan Basta    Summary:   1. Left ventricular ejection fraction, by  visual estimation, is >75%.   2. Normal global left ventricular systolic function.   3. Mildly dilated left atrium.  2D AND M-MODE MEASUREMENTS (normal ranges within parentheses):  Left Ventricle:          Normal  IVSd (2D):      1.10 cm (0.7-1.1)  LVPWd (2D):     1.05 cm (0.7-1.1) Aorta/LA:                  Normal  LVIDd (2D):     4.51 cm (3.4-5.7) Aortic Root (2D): 3.20 cm (2.4-3.7)  LVIDs (2D):     2.24 cm           Left Atrium (2D): 4.20 cm (1.9-4.0)  LV FS (2D):     50.3 %   (>25%)  LV EF (2D):     81.8 %   (>50%)                                    Right Ventricle:                                    RVd (2D):        6.38 cm  LV DIASTOLIC FUNCTION:  MV Peak E: 0.75 m/s E/e' Ratio: 13.00  MV Peak A: 0.90 m/s Decel Time: 327 msec  E/A Ratio: 0.84  SPECTRAL DOPPLER ANALYSIS (where applicable):  Mitral Valve:  MV P1/2 Time: 94.83 msec  MV Area, PHT: 2.32 cm??    PHYSICIAN INTERPRETATION:  Left Ventricle: The left ventricular internal cavity size was normal. LV   septal wall thickness was normal. LV posterior wall thickness was normal.   Global LV systolic function was normal. Left ventricular ejection   fraction, by visual estimation, is >75%.  Right Ventricle:  The right ventricular size is normal. Global RV systolic   function is normal.  Left Atrium: The left atrium is mildly dilated.  Mitral Valve: The mitral valve is not well seen.  Tricuspid Valve: The tricuspid valve is not well seen.  Aortic Valve: The aortic valve was not well seen. No evidence of aortic   valve regurgitation is seen.    [place] MD  Electronically signed by [id number] Bill MD  Signature Date/Time: 12/26/2013/11:40:21 AM    *** Final ***    IMPRESSION: .    Verified By: Teodoro Spray, M.D., MD    No Known Allergies:   Vital Signs/Nurse's Notes: **Vital Signs.:   17-Jul-15 15:23  Vital Signs Type Routine  Temperature Temperature (F) 98.1  Celsius 36.7  Pulse Pulse 89   Respirations Respirations 20  Systolic BP Systolic BP 122  Diastolic BP (mmHg) Diastolic BP (mmHg) 76  Mean BP 103  Pulse Ox % Pulse Ox % 97  Pulse Ox Activity Level  At rest  Oxygen Delivery 2L  *Intake and Output.:   17-Jul-15 12:00  Grand Totals Intake:  240 Output:      Net:  240 20 Hr.:  480  Oral Intake      In:  240    12:00  Percentage of Meal Eaten  100    Shift 15:00  Grand Totals Intake:  480 Output:      Net:  480 47 Hr.:  480  Oral Intake      In:  480  Length of Stay Totals Intake:  1110 Output:  300    Net:  40    Impression 59 y/o F with COPD with on going tobacco use, HTN, hyperlipidemia, CAD (followed by [facility]), cath 04/21/2013 with non obstructive disease who developed cough, SOB, lightheadedness, and associated chest pain with coughing for 2 days and subsequent elevated troponin of 0.07->0.07->0.08 in the setting of respiratory distress.  1. Elevated troponin: She has mildly elevated troponin of 0.07->0.07->0.08 on 12/24/2013. Her echo today revealed EF >75%, normal LV systolic function, and a mildly dilated left atrium. This is likely demand ischemia in the setting of her COPD exacerbation. She underwent a cath at [facility] 04/21/2013 that revealed nonobstructive disease. See detailed report in my note above.Continue medical management. Lipid pending. Must stop smoking.   2. Hypertension: Controlled. Consider bb.   3. Copd exacerbation: abx, nebs, steroids per IM.   4. Tobacco abuse: Must stop smoking.   Electronic Signatures for Addendum Section:  Kathlyn Sacramento (MD) (Signed Addendum 17-Jul-15 19:32)  The patient was seen and examined. Agree with the above. She presented with COPD exacerbation and was found to have mildly elevated TnI. No ischemic ECG changes. Echo showed normal EF and wall motion. Previous cath at [facility] in 04/2013 showed no significant CAD.  Elevated TnI is likely due to supply demand ischemia. No further cardiac work up is recommended.    Electronic Signatures: Kathlyn Sacramento (MD)  (Signed 17-Jul-15 19:32)  Co-Signer: General Aspect/Present Illness, Home Medications, Allergies Idolina Primer, Hortensia Duffin M (PA-C)  (Signed 17-Jul-15 16:49)  Authored: General Aspect/Present Illness, History and Physical Exam, Review of System, Family & Social History, Past Medical History, Home Medications, Labs, EKG , Radiology, Allergies, Vital Signs/Nurse's Notes, Impression/Plan   Last Updated: 17-Jul-15 19:32 by Kathlyn Sacramento (MD)

## 2015-05-06 DIAGNOSIS — J449 Chronic obstructive pulmonary disease, unspecified: Secondary | ICD-10-CM | POA: Diagnosis present

## 2016-08-08 ENCOUNTER — Emergency Department
Admission: EM | Admit: 2016-08-08 | Discharge: 2016-08-09 | Disposition: A | Discharge status: Home / Self Care | Payer: Medicare Other | Attending: Emergency Medicine | Admitting: Emergency Medicine

## 2016-08-08 ENCOUNTER — Encounter: Payer: Self-pay | Admitting: Emergency Medicine

## 2016-08-08 ENCOUNTER — Emergency Department: Payer: Medicare Other

## 2016-08-08 DIAGNOSIS — Z79899 Other long term (current) drug therapy: Secondary | ICD-10-CM | POA: Insufficient documentation

## 2016-08-08 DIAGNOSIS — J449 Chronic obstructive pulmonary disease, unspecified: Secondary | ICD-10-CM | POA: Insufficient documentation

## 2016-08-08 DIAGNOSIS — J069 Acute upper respiratory infection, unspecified: Secondary | ICD-10-CM

## 2016-08-08 DIAGNOSIS — J40 Bronchitis, not specified as acute or chronic: Secondary | ICD-10-CM | POA: Diagnosis not present

## 2016-08-08 DIAGNOSIS — R05 Cough: Secondary | ICD-10-CM | POA: Diagnosis present

## 2016-08-08 DIAGNOSIS — B9789 Other viral agents as the cause of diseases classified elsewhere: Secondary | ICD-10-CM

## 2016-08-08 DIAGNOSIS — I10 Essential (primary) hypertension: Secondary | ICD-10-CM | POA: Diagnosis not present

## 2016-08-08 DIAGNOSIS — F17210 Nicotine dependence, cigarettes, uncomplicated: Secondary | ICD-10-CM | POA: Insufficient documentation

## 2016-08-08 DIAGNOSIS — Z7982 Long term (current) use of aspirin: Secondary | ICD-10-CM | POA: Insufficient documentation

## 2016-08-08 DIAGNOSIS — J42 Unspecified chronic bronchitis: Secondary | ICD-10-CM

## 2016-08-08 HISTORY — DX: Essential (primary) hypertension: I10

## 2016-08-08 HISTORY — DX: Chronic obstructive pulmonary disease, unspecified: J44.9

## 2016-08-08 LAB — BASIC METABOLIC PANEL
ANION GAP: 5 (ref 5–15)
BUN: 15 mg/dL (ref 6–20)
CALCIUM: 9.2 mg/dL (ref 8.9–10.3)
CO2: 26 mmol/L (ref 22–32)
Chloride: 107 mmol/L (ref 101–111)
Creatinine, Ser: 0.88 mg/dL (ref 0.44–1.00)
GLUCOSE: 106 mg/dL — AB (ref 65–99)
POTASSIUM: 3.6 mmol/L (ref 3.5–5.1)
Sodium: 138 mmol/L (ref 135–145)

## 2016-08-08 LAB — CBC
HCT: 40.1 % (ref 35.0–47.0)
Hemoglobin: 13.9 g/dL (ref 12.0–16.0)
MCH: 32.1 pg (ref 26.0–34.0)
MCHC: 34.7 g/dL (ref 32.0–36.0)
MCV: 92.4 fL (ref 80.0–100.0)
PLATELETS: 257 10*3/uL (ref 150–440)
RBC: 4.34 MIL/uL (ref 3.80–5.20)
RDW: 13.4 % (ref 11.5–14.5)
WBC: 10.9 10*3/uL (ref 3.6–11.0)

## 2016-08-08 LAB — TROPONIN I: Troponin I: 0.03 ng/mL (ref <0.03)

## 2016-08-08 NOTE — ED Triage Notes (Addendum)
Pt to triage via w/c with no distress noted; st since yesterday not feeling well with chills, prod cough, sinus congestion, and frontal HA, SHOB, dizziness

## 2016-08-09 DIAGNOSIS — J069 Acute upper respiratory infection, unspecified: Secondary | ICD-10-CM | POA: Diagnosis not present

## 2016-08-09 LAB — TROPONIN I: Troponin I: 0.03 ng/mL (ref <0.03)

## 2016-08-09 MED ORDER — IPRATROPIUM-ALBUTEROL 0.5-2.5 (3) MG/3ML IN SOLN
3.0000 mL | Freq: Once | RESPIRATORY_TRACT | Status: AC
  Administered 2016-08-09: 3 mL via RESPIRATORY_TRACT
  Filled 2016-08-09: qty 3

## 2016-08-09 MED ORDER — PREDNISONE 20 MG PO TABS: ORAL_TABLET | ORAL | 0 refills | Status: DC

## 2016-08-09 MED ORDER — SODIUM CHLORIDE 0.9 % IV BOLUS (SEPSIS)
500.0000 mL | Freq: Once | INTRAVENOUS | Status: AC
  Administered 2016-08-09: 500 mL via INTRAVENOUS

## 2016-08-09 MED ORDER — KETOROLAC TROMETHAMINE 30 MG/ML IJ SOLN
10.0000 mg | Freq: Once | INTRAMUSCULAR | Status: AC
  Administered 2016-08-09: 9.9 mg via INTRAVENOUS
  Filled 2016-08-09: qty 1

## 2016-08-09 MED ORDER — PREDNISONE 20 MG PO TABS
60.0000 mg | ORAL_TABLET | Freq: Once | ORAL | Status: AC
  Administered 2016-08-09: 60 mg via ORAL
  Filled 2016-08-09: qty 3

## 2016-08-09 NOTE — ED Notes (Signed)
Lab called for troponin results

## 2016-08-09 NOTE — ED Provider Notes (Signed)
[facility] Emergency Department Provider Note   ____________________________________________   First MD Initiated Contact with Patient 08/09/16 0002     (approximate)  I have reviewed the triage vital signs and the nursing notes.   HISTORY  Chief Complaint Cough; Nasal Congestion; Headache; and Shortness of Breath    HPI Kristi Adams is a 61 y.o. female who presents to the ED from home with a chief complaint of flulike symptoms. Patient reports a three-day history of generalized body aches, chills, cough productive of yellow sputum, sinus congestion, frontal headache, shortness of breath and dizziness. Denies fever, chest pain, abdominal pain, nausea, vomiting, diarrhea. Denies recent travel or trauma. Nothing makes her symptoms better or worse.   Past Medical History:  Diagnosis Date  . COPD (chronic obstructive pulmonary disease) (HCC)   . Hypertension     There are no active problems to display for this patient.   History reviewed. No pertinent surgical history.  Prior to Admission medications   Medication Sig Start Date End Date Taking? Authorizing Provider  albuterol (PROVENTIL HFA;VENTOLIN HFA) 108 (90 Base) MCG/ACT inhaler Inhale 1-2 puffs into the lungs every 6 (six) hours as needed for wheezing or shortness of breath.   Yes Historical Provider, MD  aspirin EC 81 MG tablet Take 81 mg by mouth daily.   Yes Historical Provider, MD  budesonide-formoterol (SYMBICORT) 160-4.5 MCG/ACT inhaler Inhale 2 puffs into the lungs 2 (two) times daily.   Yes Historical Provider, MD  isosorbide mononitrate (IMDUR) 30 MG 24 hr tablet Take 30 mg by mouth daily.   Yes Historical Provider, MD  losartan (COZAAR) 100 MG tablet Take 100 mg by mouth daily.   Yes Historical Provider, MD  metoprolol tartrate (LOPRESSOR) 25 MG tablet Take 25 mg by mouth 2 (two) times daily.   Yes Historical Provider, MD  omeprazole (PRILOSEC) 40 MG capsule Take 40 mg by mouth  daily.   Yes Historical Provider, MD  rosuvastatin (CRESTOR) 10 MG tablet Take 10 mg by mouth every evening.   Yes Historical Provider, MD  tiotropium (SPIRIVA) 18 MCG inhalation capsule Place 18 mcg into inhaler and inhale daily.   Yes Historical Provider, MD  predniSONE (DELTASONE) 20 MG tablet 3 tablets daily x 4 days 08/09/16   Irean Hong, MD    Allergies Patient has no known allergies.  No family history on file.  Social History Social History  Substance Use Topics  . Smoking status: Current Every Day Smoker    Packs/day: 1.00    Types: Cigarettes  . Smokeless tobacco: Never Used  . Alcohol use No    Review of Systems  Constitutional: Positive for chills and body aches. Eyes: No visual changes. ENT: No sore throat. Cardiovascular: Denies chest pain. Respiratory: Positive for productive cough and shortness of breath. Gastrointestinal: No abdominal pain.  No nausea, no vomiting.  No diarrhea.  No constipation. Genitourinary: Negative for dysuria. Musculoskeletal: Negative for back pain. Skin: Negative for rash. Neurological: Negative for headaches, focal weakness or numbness.  10-point ROS otherwise negative.  ____________________________________________   PHYSICAL EXAM:  VITAL SIGNS: ED Triage Vitals  Enc Vitals Group     BP 08/08/16 2303 125/77     Pulse Rate 08/08/16 2303 92     Resp 08/08/16 2303 (!) 26     Temp 08/08/16 2303 100 F (37.8 C)     Temp Source 08/08/16 2303 Oral     SpO2 08/08/16 2303 92 %     Weight  08/08/16 2305 172 lb (78 kg)     Height 08/08/16 2305 5' (1.524 m)     Head Circumference --      Peak Flow --      Pain Score 08/08/16 2303 6     Pain Loc --      Pain Edu? --      Excl. in GC? --     Constitutional: Alert and oriented. Well appearing and in no acute distress. Eyes: Conjunctivae are normal. PERRL. EOMI. Head: Atraumatic. Nose: Congestion/rhinnorhea. Mouth/Throat: Mucous membranes are moist.  Oropharynx  non-erythematous. Neck: No stridor.  Supple neck without meningismus. Cardiovascular: Normal rate, regular rhythm. Grossly normal heart sounds.  Good peripheral circulation. Respiratory: Normal respiratory effort.  No retractions. Lungs with scattered rhonchi and slight wheezing. Gastrointestinal: Soft and nontender. No distention. No abdominal bruits. No CVA tenderness. Musculoskeletal: No lower extremity tenderness nor edema.  No joint effusions. Neurologic:  Normal speech and language. No gross focal neurologic deficits are appreciated. No gait instability. Skin:  Skin is warm, dry and intact. No rash noted. No petechiae. Psychiatric: Mood and affect are normal. Speech and behavior are normal.  ____________________________________________   LABS (all labs ordered are listed, but only abnormal results are displayed)  Labs Reviewed  BASIC METABOLIC PANEL - Abnormal; Notable for the following:       Result Value   Glucose, Bld 106 (*)    All other components within normal limits  TROPONIN I - Abnormal; Notable for the following:    Troponin I 0.03 (*)    All other components within normal limits  TROPONIN I - Abnormal; Notable for the following:    Troponin I 0.03 (*)    All other components within normal limits  CBC   ____________________________________________  EKG  ED ECG REPORT I, Betsey Sossamon J, the attending physician, personally viewed and interpreted this ECG.   Date: 08/09/2016  EKG Time: 2306  Rate: 93  Rhythm: normal EKG, normal sinus rhythm  Axis: Normal  Intervals:none  ST&T Change: T-wave inversion inferior lateral leads; new T-wave inversion in inferior leads compared to November 2015  ____________________________________________  RADIOLOGY  Chest 2 view interpreted per Dr. Sterling Big: Mild increase in interstitial prominence consistent with bronchitic  change. Aortic atherosclerosis. No pneumonic consolidations nor CHF.    ____________________________________________   PROCEDURES  Procedure(s) performed: None  Procedures  Critical Care performed: No  ____________________________________________   INITIAL IMPRESSION / ASSESSMENT AND PLAN / ED COURSE  Pertinent labs & imaging results that were available during my care of the patient were reviewed by me and considered in my medical decision making (see chart for details).  61 year old female with a history of COPD presenting with flulike symptoms. Will initiate judicious IV fluids (patient reports history of CHF), NSAIDs, nebulizer treatment and reassess.  Clinical Course as of Aug 09 508  Wed Aug 09, 2016  0326 Patient sleeping in no acute distress. Repeat troponin unchanged. Room air saturations 93-96%. Wheezing improved. Discussed with patient; will place on 5 day prednisone burst and she will follow-up with her PCP closely. Strict return precautions given. Patient verbalizes understanding and agrees with plan of care.  [JS]    Clinical Course User Index [JS] Irean Hong, MD     ____________________________________________   FINAL CLINICAL IMPRESSION(S) / ED DIAGNOSES  Final diagnoses:  Viral URI with cough  Chronic bronchitis, unspecified chronic bronchitis type (HCC)      NEW MEDICATIONS STARTED DURING THIS VISIT:  New Prescriptions  PREDNISONE (DELTASONE) 20 MG TABLET    3 tablets daily x 4 days     Note:  This document was prepared using Dragon voice recognition software and may include unintentional dictation errors.    Irean HongJade J Sherida Dobkins, MD 08/09/16 [phone]

## 2016-08-09 NOTE — Discharge Instructions (Signed)
1. Finish prednisone 60 mg daily 4 days. Start your next dose on Thursday morning.  2. Continue your inhalers and nebulizers every 4 hours as needed for wheezing or difficulty breathing. 3. Return to the ER for worsening symptoms, persistent vomiting, difficulty breathing or other concerns.

## 2016-08-09 NOTE — ED Notes (Signed)
Lab reports troponin value of 0.03

## 2016-12-12 ENCOUNTER — Other Ambulatory Visit: Payer: Self-pay | Admitting: Family Medicine

## 2016-12-12 DIAGNOSIS — Z1231 Encounter for screening mammogram for malignant neoplasm of breast: Secondary | ICD-10-CM

## 2017-01-16 ENCOUNTER — Telehealth: Payer: Self-pay | Admitting: *Deleted

## 2017-01-16 NOTE — Telephone Encounter (Signed)
Received referral for low dose lung cancer screening CT scan.  Attempted to leave message at phone number listed in EMR for patient to call me back to facilitate scheduling scan. However, this option is not available. Will attempt at later date.  

## 2017-01-22 ENCOUNTER — Inpatient Hospital Stay: Admission: RE | Admit: 2017-01-22 | Payer: Medicare Other | Source: Ambulatory Visit

## 2017-01-24 ENCOUNTER — Ambulatory Visit: Payer: Medicare Other

## 2017-01-30 ENCOUNTER — Ambulatory Visit
Admission: RE | Admit: 2017-01-30 | Discharge: 2017-01-30 | Disposition: A | Discharge status: Home / Self Care | Payer: Medicare Other | Source: Ambulatory Visit | Attending: Family Medicine | Admitting: Family Medicine

## 2017-01-30 DIAGNOSIS — Z1231 Encounter for screening mammogram for malignant neoplasm of breast: Secondary | ICD-10-CM | POA: Insufficient documentation

## 2017-02-02 ENCOUNTER — Encounter: Payer: Self-pay | Admitting: *Deleted

## 2017-02-22 ENCOUNTER — Telehealth: Payer: Self-pay | Admitting: *Deleted

## 2017-02-22 DIAGNOSIS — Z122 Encounter for screening for malignant neoplasm of respiratory organs: Secondary | ICD-10-CM

## 2017-02-22 DIAGNOSIS — Z87891 Personal history of nicotine dependence: Secondary | ICD-10-CM

## 2017-02-22 NOTE — Telephone Encounter (Signed)
Received referral for initial lung cancer screening scan. Contacted patient and obtained smoking history,(current, 40 pack year) as well as answering questions related to screening process. Patient denies signs of lung cancer such as weight loss or hemoptysis. Patient denies comorbidity that would prevent curative treatment if lung cancer were found. Patient is scheduled for shared decision making visit and CT scan on 03/06/17.  

## 2017-02-22 NOTE — Telephone Encounter (Signed)
error 

## 2017-03-06 ENCOUNTER — Encounter: Payer: Self-pay | Admitting: Oncology

## 2017-03-06 ENCOUNTER — Inpatient Hospital Stay: Payer: Medicare Other | Attending: Oncology | Admitting: Oncology

## 2017-03-06 ENCOUNTER — Ambulatory Visit
Admission: RE | Admit: 2017-03-06 | Discharge: 2017-03-06 | Disposition: A | Discharge status: Home / Self Care | Payer: Medicare Other | Source: Ambulatory Visit | Attending: Oncology | Admitting: Oncology

## 2017-03-06 DIAGNOSIS — Z87891 Personal history of nicotine dependence: Secondary | ICD-10-CM | POA: Diagnosis not present

## 2017-03-06 DIAGNOSIS — I2510 Atherosclerotic heart disease of native coronary artery without angina pectoris: Secondary | ICD-10-CM | POA: Diagnosis not present

## 2017-03-06 DIAGNOSIS — J439 Emphysema, unspecified: Secondary | ICD-10-CM | POA: Insufficient documentation

## 2017-03-06 DIAGNOSIS — Z720 Tobacco use: Secondary | ICD-10-CM | POA: Insufficient documentation

## 2017-03-06 DIAGNOSIS — I700 Atherosclerosis of aorta: Secondary | ICD-10-CM | POA: Insufficient documentation

## 2017-03-06 DIAGNOSIS — Z122 Encounter for screening for malignant neoplasm of respiratory organs: Secondary | ICD-10-CM

## 2017-03-06 DIAGNOSIS — F17210 Nicotine dependence, cigarettes, uncomplicated: Secondary | ICD-10-CM | POA: Diagnosis not present

## 2017-03-06 DIAGNOSIS — R918 Other nonspecific abnormal finding of lung field: Secondary | ICD-10-CM | POA: Insufficient documentation

## 2017-03-06 NOTE — Progress Notes (Signed)
In accordance with CMS guidelines, patient has met eligibility criteria including age, absence of signs or symptoms of lung cancer.  Social History  Substance Use Topics  . Smoking status: Current Every Day Smoker    Packs/day: 1.00    Years: 40.00    Types: Cigarettes  . Smokeless tobacco: Never Used  . Alcohol use No     A shared decision-making session was conducted prior to the performance of CT scan. This includes one or more decision aids, includes benefits and harms of screening, follow-up diagnostic testing, over-diagnosis, false positive rate, and total radiation exposure.  Counseling on the importance of adherence to annual lung cancer LDCT screening, impact of co-morbidities, and ability or willingness to undergo diagnosis and treatment is imperative for compliance of the program.  Counseling on the importance of continued smoking cessation for former smokers; the importance of smoking cessation for current smokers, and information about tobacco cessation interventions have been given to patient including [place] and [employer] quit [place] programs.  Written order for lung cancer screening with LDCT has been given to the patient and any and all questions have been answered to the best of my abilities.   Yearly follow up will be coordinated by Burgess Estelle, Thoracic Navigator.  Beckey Rutter, NP 03/06/17 2:51 PM

## 2017-03-09 ENCOUNTER — Telehealth: Payer: Self-pay | Admitting: *Deleted

## 2017-03-09 NOTE — Telephone Encounter (Signed)
Notified patient of LDCT lung cancer screening program results with recommendation for 6 month follow up imaging. Also notified of incidental findings noted below and is encouraged to discuss further with PCP who will receive a copy of this note and/or the CT report. Patient verbalizes understanding.   IMPRESSION: 1. Lung-RADS 3S, probably benign findings. Short-term follow-up in 6 months is recommended with repeat low-dose chest CT without contrast (please use the following order, "CT CHEST LCS NODULE FOLLOW-UP W/O CM"). 2. The "S" modifier above refers to potentially clinically significant non lung cancer related findings. Specifically, there is aortic atherosclerosis, in addition to 2 vessel coronary artery disease. Please note that although the presence of coronary artery calcium documents the presence of coronary artery disease, the severity of this disease and any potential stenosis cannot be assessed on this non-gated CT examination. Assessment for potential risk factor modification, dietary therapy or pharmacologic therapy may be warranted, if clinically indicated. 3. Mild diffuse bronchial wall thickening with mild centrilobular and paraseptal emphysema; imaging findings suggestive of underlying COPD. 4. Mild cardiomegaly.  Aortic Atherosclerosis (ICD10-I70.0) and Emphysema (ICD10-J43.9).

## 2017-09-04 ENCOUNTER — Telehealth: Payer: Self-pay | Admitting: *Deleted

## 2017-09-04 DIAGNOSIS — R918 Other nonspecific abnormal finding of lung field: Secondary | ICD-10-CM

## 2017-09-04 DIAGNOSIS — Z87891 Personal history of nicotine dependence: Secondary | ICD-10-CM

## 2017-09-04 DIAGNOSIS — Z122 Encounter for screening for malignant neoplasm of respiratory organs: Secondary | ICD-10-CM

## 2017-09-04 NOTE — Telephone Encounter (Signed)
Notified patient that lung cancer screening low dose CT scan follow up is due currently or will be in near future. Confirmed that patient is within the age range of 55-77, and asymptomatic, (no signs or symptoms of lung cancer). Patient denies illness that would prevent curative treatment for lung cancer if found. Verified smoking history, (current, 40.5 pack year). The shared decision making visit was done 03/06/17. Patient is agreeable for CT scan being scheduled.

## 2017-09-10 ENCOUNTER — Ambulatory Visit
Admission: RE | Admit: 2017-09-10 | Discharge: 2017-09-10 | Disposition: A | Discharge status: Home / Self Care | Payer: Medicare Other | Source: Ambulatory Visit | Attending: Oncology | Admitting: Oncology

## 2017-09-10 DIAGNOSIS — I700 Atherosclerosis of aorta: Secondary | ICD-10-CM | POA: Insufficient documentation

## 2017-09-10 DIAGNOSIS — Z122 Encounter for screening for malignant neoplasm of respiratory organs: Secondary | ICD-10-CM

## 2017-09-10 DIAGNOSIS — R918 Other nonspecific abnormal finding of lung field: Secondary | ICD-10-CM

## 2017-09-10 DIAGNOSIS — Z87891 Personal history of nicotine dependence: Secondary | ICD-10-CM | POA: Diagnosis present

## 2017-09-10 DIAGNOSIS — J439 Emphysema, unspecified: Secondary | ICD-10-CM | POA: Insufficient documentation

## 2017-09-11 ENCOUNTER — Telehealth: Payer: Self-pay | Admitting: *Deleted

## 2017-09-11 NOTE — Telephone Encounter (Signed)
Notified patient of LDCT lung cancer screening program results with recommendation for 3 month follow up imaging. Also notified of incidental findings noted below and is encouraged to discuss further with PCP who will receive a copy of this note and/or the CT report. Patient verbalizes understanding and reports recent hospitalization and treatment for respiratory infection.  IMPRESSION: Lung-RADS 4A, suspicious. Follow up low-dose chest CT without contrast in 3 months (please use the following order, "CT CHEST LCS NODULE FOLLOW-UP W/O CM") is recommended.  Multifocal patchy/nodular opacities in the bilateral lower lobes, favoring multifocal infection. Prior dominant subsolid/ground-glass nodular opacity in the anterior right lower lobe has resolved.  Aortic Atherosclerosis (ICD10-I70.0) and Emphysema (ICD10-J43.9).

## 2017-12-06 ENCOUNTER — Telehealth: Payer: Self-pay | Admitting: Nurse Practitioner

## 2017-12-07 ENCOUNTER — Telehealth: Payer: Self-pay | Admitting: *Deleted

## 2017-12-07 DIAGNOSIS — Z122 Encounter for screening for malignant neoplasm of respiratory organs: Secondary | ICD-10-CM

## 2017-12-07 DIAGNOSIS — Z87891 Personal history of nicotine dependence: Secondary | ICD-10-CM

## 2017-12-07 DIAGNOSIS — R918 Other nonspecific abnormal finding of lung field: Secondary | ICD-10-CM

## 2017-12-07 NOTE — Telephone Encounter (Signed)
Notified patient that lung cancer screening CT scan follow up is due currently or will be in near future. Confirmed that patient is within the age range of 55-77, and asymptomatic, (no signs or symptoms of lung cancer). Patient denies illness that would prevent curative treatment for lung cancer if found. Verified smoking history, (current, 41 pack year). The shared decision making visit was done 03/06/17. Patient is agreeable for CT scan being scheduled.

## 2017-12-12 ENCOUNTER — Ambulatory Visit
Admission: RE | Admit: 2017-12-12 | Discharge: 2017-12-12 | Disposition: A | Discharge status: Home / Self Care | Payer: Medicare Other | Source: Ambulatory Visit | Attending: Nurse Practitioner | Admitting: Nurse Practitioner

## 2017-12-12 DIAGNOSIS — Z87891 Personal history of nicotine dependence: Secondary | ICD-10-CM | POA: Insufficient documentation

## 2017-12-12 DIAGNOSIS — Z122 Encounter for screening for malignant neoplasm of respiratory organs: Secondary | ICD-10-CM | POA: Diagnosis present

## 2017-12-12 DIAGNOSIS — J439 Emphysema, unspecified: Secondary | ICD-10-CM | POA: Insufficient documentation

## 2017-12-12 DIAGNOSIS — R918 Other nonspecific abnormal finding of lung field: Secondary | ICD-10-CM | POA: Insufficient documentation

## 2017-12-12 DIAGNOSIS — I700 Atherosclerosis of aorta: Secondary | ICD-10-CM | POA: Insufficient documentation

## 2017-12-12 DIAGNOSIS — I2510 Atherosclerotic heart disease of native coronary artery without angina pectoris: Secondary | ICD-10-CM | POA: Diagnosis not present

## 2017-12-14 ENCOUNTER — Encounter: Payer: Self-pay | Admitting: *Deleted

## 2018-03-19 ENCOUNTER — Other Ambulatory Visit: Payer: Self-pay | Admitting: Family Medicine

## 2018-03-19 DIAGNOSIS — Z1231 Encounter for screening mammogram for malignant neoplasm of breast: Secondary | ICD-10-CM

## 2018-12-20 ENCOUNTER — Telehealth: Payer: Self-pay | Admitting: *Deleted

## 2018-12-20 NOTE — Telephone Encounter (Signed)
Patient has been notified that lung cancer screening CT scan is due currently or will be in near future. Confirmed that patient is within the appropriate age range, and asymptomatic, (no signs or symptoms of lung cancer). Patient denies illness that would prevent curative treatment for lung cancer if found. Verified smoking history (current , 1ppd.). Patient is agreeable for CT scan being scheduled and has no preference to day or time.

## 2018-12-23 ENCOUNTER — Other Ambulatory Visit: Payer: Self-pay | Admitting: *Deleted

## 2018-12-23 DIAGNOSIS — Z122 Encounter for screening for malignant neoplasm of respiratory organs: Secondary | ICD-10-CM

## 2018-12-23 DIAGNOSIS — Z87891 Personal history of nicotine dependence: Secondary | ICD-10-CM

## 2018-12-30 ENCOUNTER — Ambulatory Visit: Admission: RE | Admit: 2018-12-30 | Payer: Medicaid Other | Source: Ambulatory Visit

## 2019-02-06 ENCOUNTER — Encounter (INDEPENDENT_AMBULATORY_CARE_PROVIDER_SITE_OTHER): Payer: Self-pay

## 2019-02-06 ENCOUNTER — Other Ambulatory Visit: Payer: Self-pay

## 2019-02-06 ENCOUNTER — Ambulatory Visit
Admission: RE | Admit: 2019-02-06 | Discharge: 2019-02-06 | Disposition: A | Discharge status: Home / Self Care | Payer: Medicare HMO | Source: Ambulatory Visit | Attending: Oncology | Admitting: Oncology

## 2019-02-06 DIAGNOSIS — Z87891 Personal history of nicotine dependence: Secondary | ICD-10-CM | POA: Diagnosis present

## 2019-02-06 DIAGNOSIS — Z122 Encounter for screening for malignant neoplasm of respiratory organs: Secondary | ICD-10-CM | POA: Insufficient documentation

## 2019-02-07 ENCOUNTER — Encounter: Payer: Self-pay | Admitting: *Deleted

## 2019-04-08 ENCOUNTER — Other Ambulatory Visit: Payer: Self-pay

## 2019-04-08 DIAGNOSIS — Z20822 Contact with and (suspected) exposure to covid-19: Secondary | ICD-10-CM

## 2019-04-10 LAB — NOVEL CORONAVIRUS, NAA: SARS-CoV-2, NAA: NOT DETECTED

## 2019-05-18 IMAGING — MG MM DIGITAL SCREENING BILAT W/ CAD
4 series · 4 of 4 positions shown · non-contrast
Comparison: Previous exam(s).

CLINICAL DATA: Screening.

EXAM:
DIGITAL SCREENING BILATERAL MAMMOGRAM WITH CAD

[R MLO]
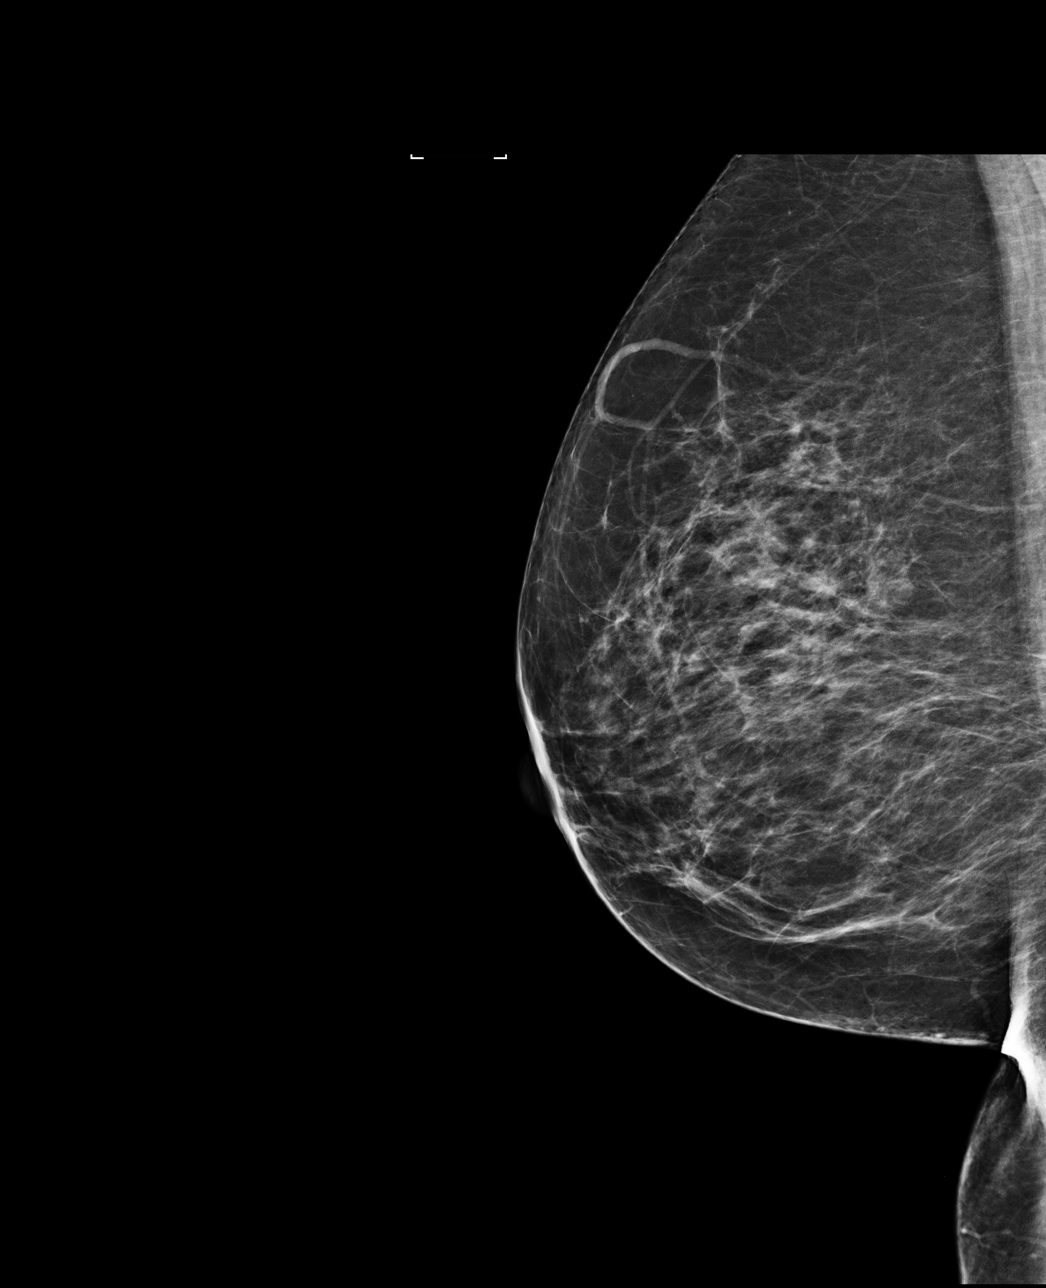

[L MLO]
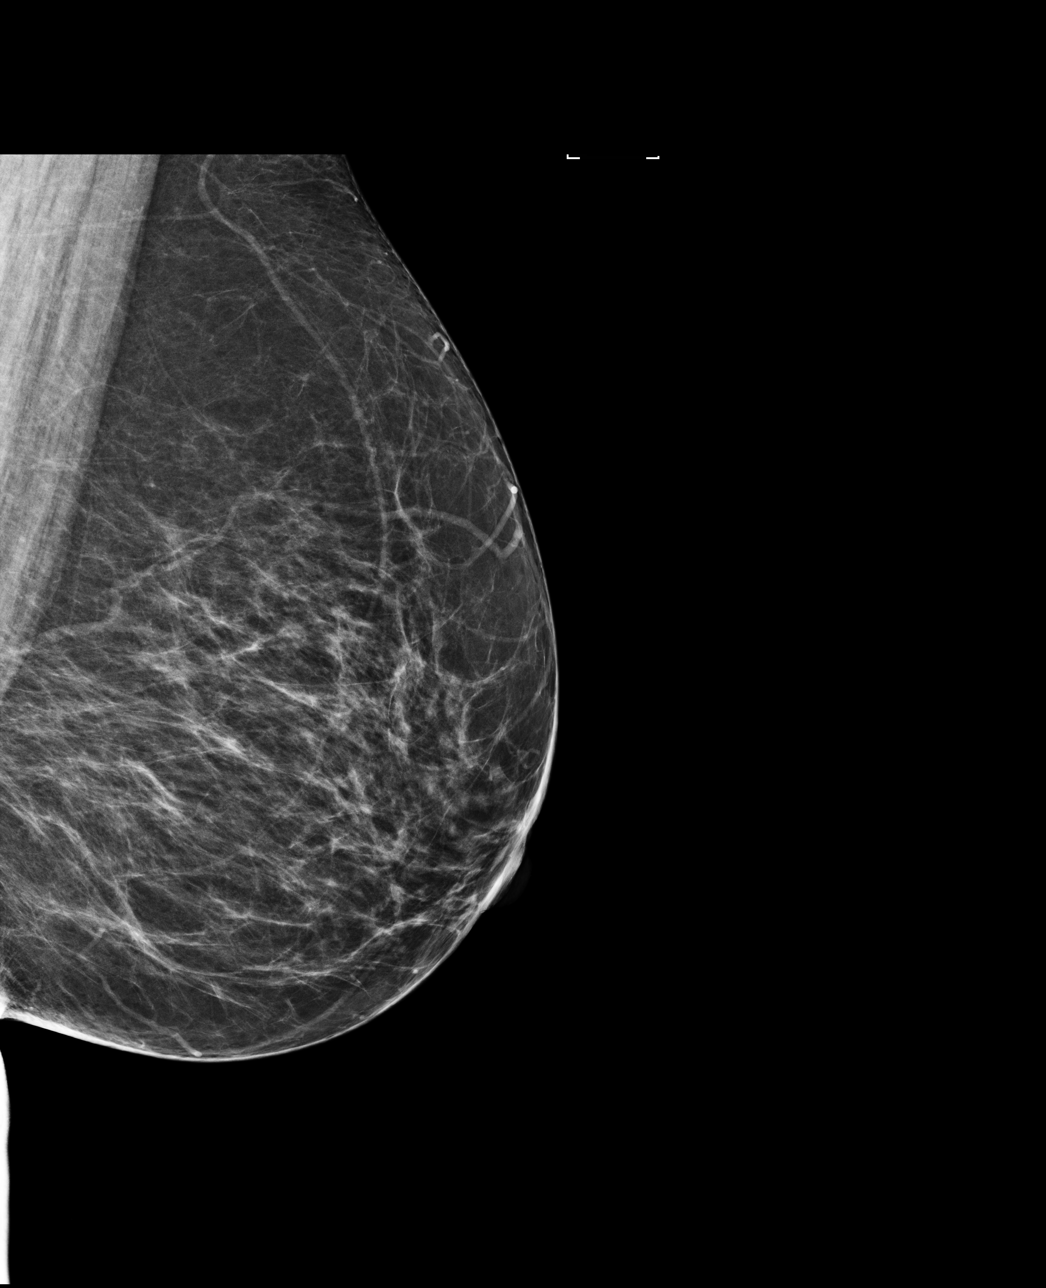

[R CC]
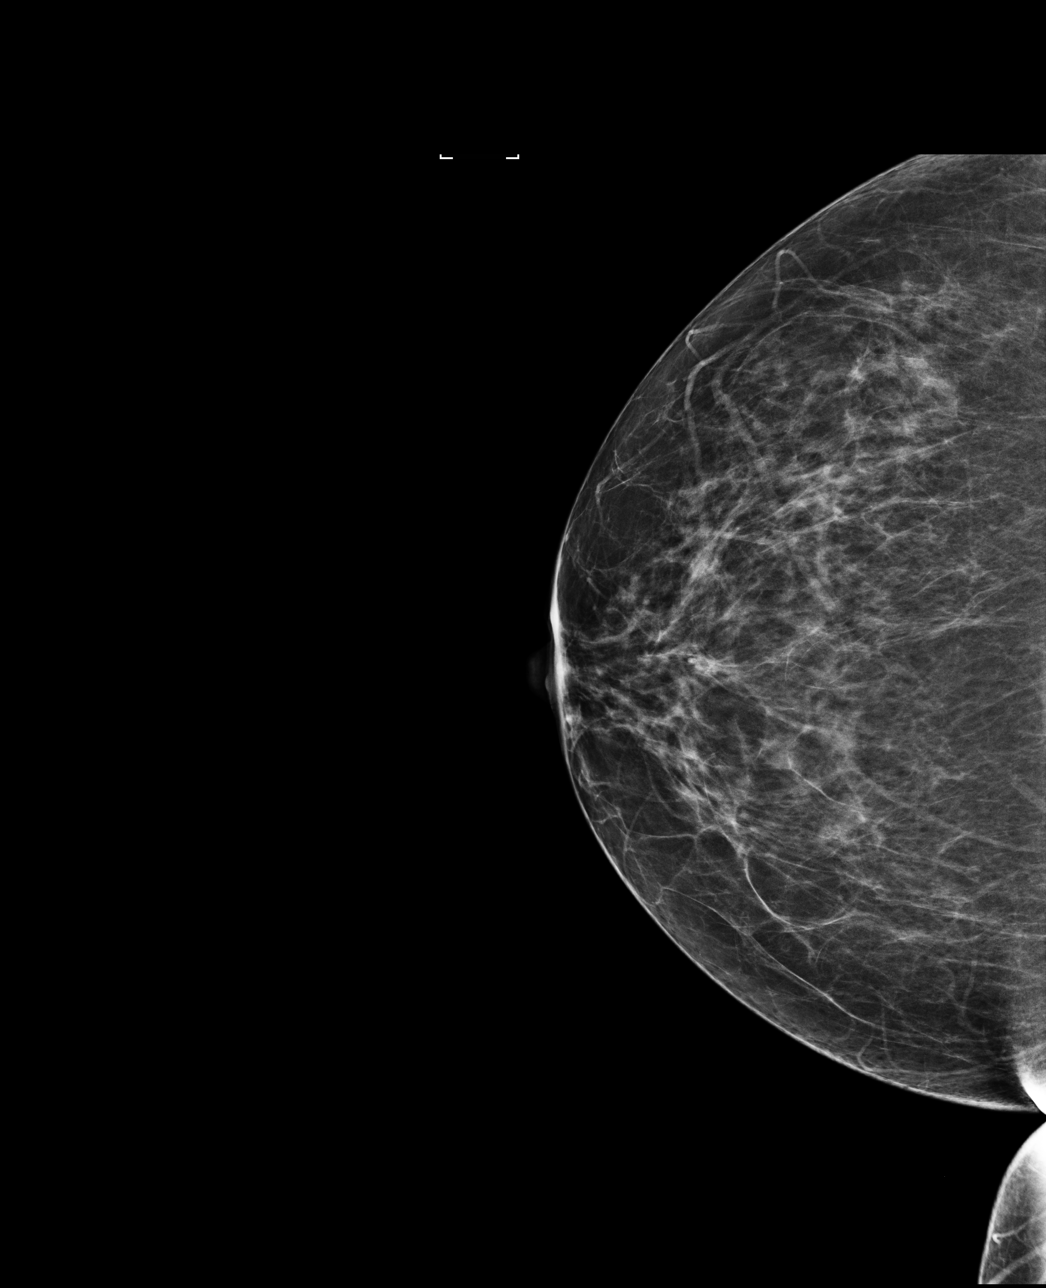

[L CC]
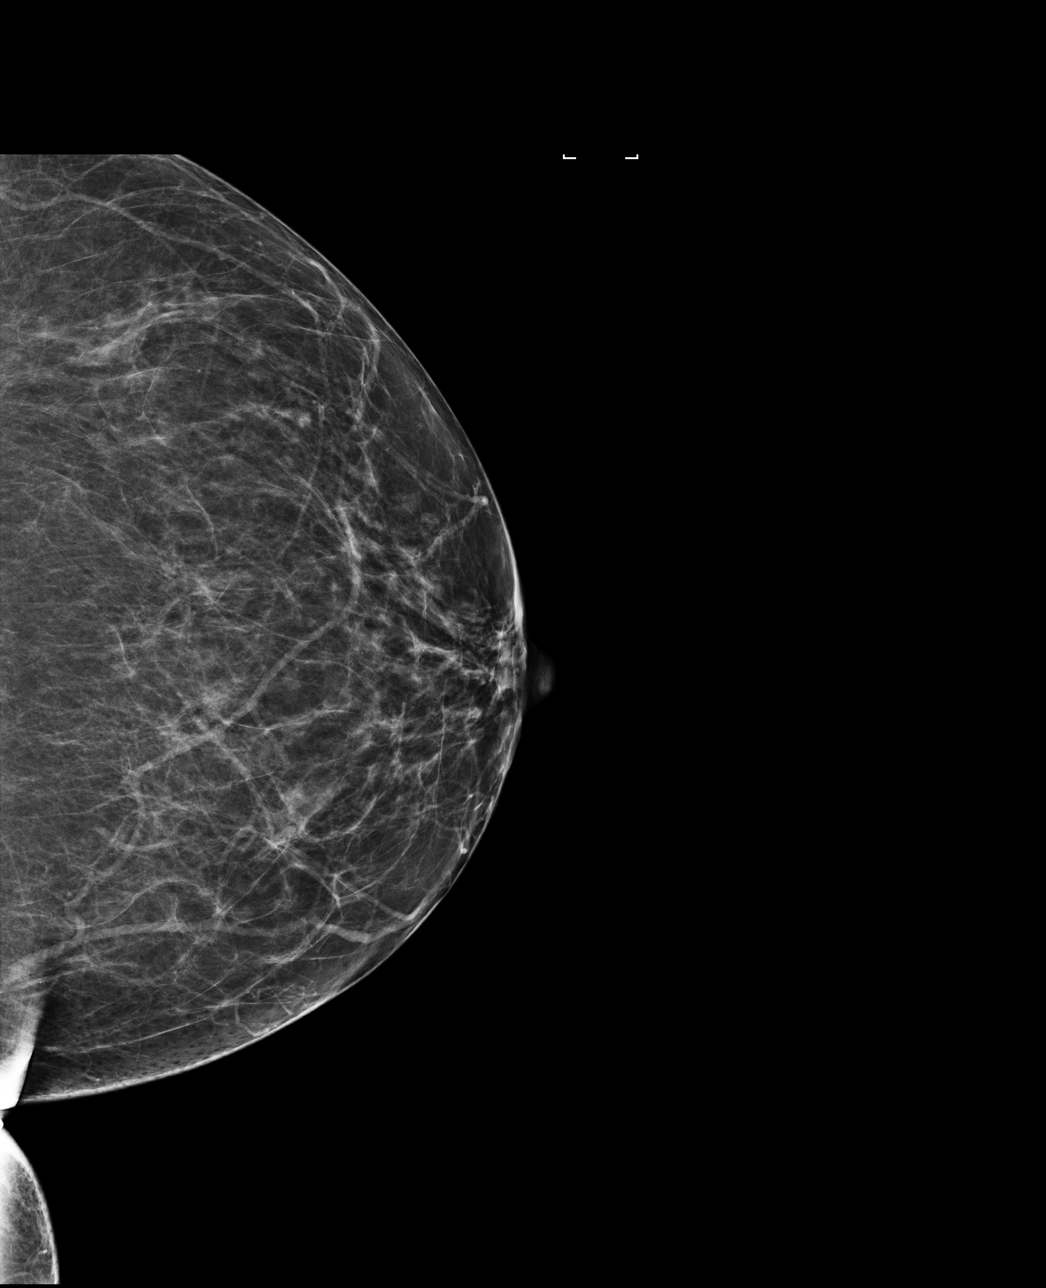

[4 of 4 positions shown; findings below may reference images not displayed]

ACR Breast Density Category b: There are scattered areas of
fibroglandular density.
FINDINGS: There are no findings suspicious for malignancy. Images were
processed with CAD.
IMPRESSION: No mammographic evidence of malignancy. A result letter of this
screening mammogram will be mailed directly to the patient.

RECOMMENDATION:
Screening mammogram in one year. (Code:AS-G-LCT)

BI-RADS CATEGORY  1: Negative.

## 2019-05-30 ENCOUNTER — Other Ambulatory Visit: Payer: Self-pay | Admitting: Family Medicine

## 2019-05-30 DIAGNOSIS — Z1231 Encounter for screening mammogram for malignant neoplasm of breast: Secondary | ICD-10-CM

## 2019-06-10 ENCOUNTER — Ambulatory Visit: Payer: Medicare HMO | Attending: Internal Medicine

## 2019-06-10 DIAGNOSIS — Z20822 Contact with and (suspected) exposure to covid-19: Secondary | ICD-10-CM

## 2019-06-12 ENCOUNTER — Telehealth: Payer: Self-pay

## 2019-06-12 LAB — NOVEL CORONAVIRUS, NAA: SARS-CoV-2, NAA: NOT DETECTED

## 2019-06-12 NOTE — Telephone Encounter (Signed)
Caller given negative result and verbalized understanding  

## 2019-07-02 ENCOUNTER — Other Ambulatory Visit: Payer: Self-pay | Admitting: Family Medicine

## 2019-07-02 DIAGNOSIS — Z1231 Encounter for screening mammogram for malignant neoplasm of breast: Secondary | ICD-10-CM

## 2019-08-14 ENCOUNTER — Ambulatory Visit
Admission: RE | Admit: 2019-08-14 | Discharge: 2019-08-14 | Disposition: A | Discharge status: Home / Self Care | Payer: Medicare HMO | Source: Ambulatory Visit | Attending: Family Medicine | Admitting: Family Medicine

## 2019-08-14 DIAGNOSIS — Z1231 Encounter for screening mammogram for malignant neoplasm of breast: Secondary | ICD-10-CM | POA: Diagnosis not present

## 2020-01-20 ENCOUNTER — Telehealth: Payer: Self-pay | Admitting: *Deleted

## 2020-01-20 NOTE — Telephone Encounter (Signed)
Writer spoke with patient on this date to discuss patient's lung cancer screening CT scan. Patient stated that she still smokes a ppd, has not had any health issues or insurance changes over the past year, and is not being evaluated or treated for cancer. CT scan scheduled for 02-12-20 at 12:30 and patient is aware of appt.

## 2020-01-21 ENCOUNTER — Other Ambulatory Visit: Payer: Self-pay | Admitting: *Deleted

## 2020-01-21 DIAGNOSIS — Z87891 Personal history of nicotine dependence: Secondary | ICD-10-CM

## 2020-01-21 DIAGNOSIS — Z122 Encounter for screening for malignant neoplasm of respiratory organs: Secondary | ICD-10-CM

## 2020-02-12 ENCOUNTER — Ambulatory Visit
Admission: RE | Admit: 2020-02-12 | Discharge: 2020-02-12 | Disposition: A | Discharge status: Home / Self Care | Payer: Medicare HMO | Source: Ambulatory Visit | Attending: Nurse Practitioner | Admitting: Nurse Practitioner

## 2020-02-12 ENCOUNTER — Other Ambulatory Visit: Payer: Self-pay

## 2020-02-12 DIAGNOSIS — Z87891 Personal history of nicotine dependence: Secondary | ICD-10-CM

## 2020-02-12 DIAGNOSIS — Z122 Encounter for screening for malignant neoplasm of respiratory organs: Secondary | ICD-10-CM | POA: Diagnosis present

## 2020-02-19 ENCOUNTER — Telehealth: Payer: Self-pay | Admitting: *Deleted

## 2020-02-19 ENCOUNTER — Other Ambulatory Visit: Payer: Self-pay | Admitting: *Deleted

## 2020-02-19 DIAGNOSIS — R918 Other nonspecific abnormal finding of lung field: Secondary | ICD-10-CM

## 2020-02-19 DIAGNOSIS — Z87891 Personal history of nicotine dependence: Secondary | ICD-10-CM

## 2020-02-19 NOTE — Telephone Encounter (Signed)
Notified patient of LDCT lung cancer screening program results with recommendation for 3 month follow up imaging. Also notified of incidental findings noted below and is encouraged to discuss further with PCP who will receive a copy of this note and/or the CT report. Patient verbalizes understanding. Appointment given for 3 month follow up. Patient knows to contact her PCP for s/s of respiratory infection.   IMPRESSION: 1. Lung-RADS 4A, suspicious. The new nodules on today's examination are favored to be of infectious or inflammatory etiology. Follow up low-dose chest CT without contrast in 3 months (please use the following order, "CT CHEST LCS NODULE FOLLOW-UP W/O CM") is recommended. 2. The "S" modifier above refers to potentially clinically significant non lung cancer related findings. Specifically, there is aortic atherosclerosis, in addition to left main and 3 vessel coronary artery disease. Please note that although the presence of coronary artery calcium documents the presence of coronary artery disease, the severity of this disease and any potential stenosis cannot be assessed on this non-gated CT examination. Assessment for potential risk factor modification, dietary therapy or pharmacologic therapy may be warranted, if clinically indicated. 3. Mild diffuse bronchial wall thickening with mild centrilobular and paraseptal emphysema; imaging findings suggestive of underlying COPD.  Aortic Atherosclerosis (ICD10-I70.0) and Emphysema (ICD10-J43.9).

## 2020-05-13 ENCOUNTER — Ambulatory Visit: Admission: RE | Admit: 2020-05-13 | Payer: Medicare HMO | Source: Ambulatory Visit

## 2020-05-19 ENCOUNTER — Telehealth: Payer: Self-pay | Admitting: *Deleted

## 2020-05-19 NOTE — Telephone Encounter (Signed)
Attempted to contact and reschedule no show LCS nodule follow up imaging. There is no answer or voicemail option available.

## 2020-05-20 ENCOUNTER — Ambulatory Visit
Admission: RE | Admit: 2020-05-20 | Discharge: 2020-05-20 | Disposition: A | Discharge status: Home / Self Care | Payer: Medicare HMO | Source: Ambulatory Visit | Attending: Oncology | Admitting: Oncology

## 2020-05-20 ENCOUNTER — Other Ambulatory Visit: Payer: Self-pay

## 2020-05-20 DIAGNOSIS — Z87891 Personal history of nicotine dependence: Secondary | ICD-10-CM

## 2020-05-20 DIAGNOSIS — I700 Atherosclerosis of aorta: Secondary | ICD-10-CM | POA: Diagnosis not present

## 2020-05-20 DIAGNOSIS — J439 Emphysema, unspecified: Secondary | ICD-10-CM | POA: Insufficient documentation

## 2020-05-20 DIAGNOSIS — R918 Other nonspecific abnormal finding of lung field: Secondary | ICD-10-CM | POA: Diagnosis not present

## 2020-05-20 DIAGNOSIS — I2510 Atherosclerotic heart disease of native coronary artery without angina pectoris: Secondary | ICD-10-CM | POA: Diagnosis not present

## 2020-05-20 DIAGNOSIS — Z122 Encounter for screening for malignant neoplasm of respiratory organs: Secondary | ICD-10-CM | POA: Diagnosis present

## 2020-05-20 DIAGNOSIS — F17210 Nicotine dependence, cigarettes, uncomplicated: Secondary | ICD-10-CM | POA: Diagnosis not present

## 2020-05-20 NOTE — Telephone Encounter (Signed)
Contacted patient and rescheduled for lung screening follow up imaging.

## 2020-05-25 ENCOUNTER — Encounter: Payer: Self-pay | Admitting: *Deleted

## 2020-09-30 ENCOUNTER — Other Ambulatory Visit: Payer: Self-pay | Admitting: Family Medicine

## 2020-09-30 DIAGNOSIS — Z1231 Encounter for screening mammogram for malignant neoplasm of breast: Secondary | ICD-10-CM

## 2020-10-13 ENCOUNTER — Ambulatory Visit
Admission: RE | Admit: 2020-10-13 | Discharge: 2020-10-13 | Disposition: A | Discharge status: Home / Self Care | Payer: Medicare HMO | Source: Ambulatory Visit | Attending: Family Medicine | Admitting: Family Medicine

## 2020-10-13 ENCOUNTER — Other Ambulatory Visit: Payer: Self-pay

## 2020-10-13 DIAGNOSIS — Z1231 Encounter for screening mammogram for malignant neoplasm of breast: Secondary | ICD-10-CM | POA: Diagnosis present

## 2021-04-25 ENCOUNTER — Observation Stay
Admission: EM | Admit: 2021-04-25 | Discharge: 2021-04-26 | Disposition: A | Discharge status: Home / Self Care | Payer: Medicare HMO | Attending: Internal Medicine | Admitting: Internal Medicine

## 2021-04-25 ENCOUNTER — Encounter: Payer: Self-pay | Admitting: Emergency Medicine

## 2021-04-25 ENCOUNTER — Other Ambulatory Visit: Payer: Self-pay

## 2021-04-25 ENCOUNTER — Emergency Department: Payer: Medicare HMO

## 2021-04-25 DIAGNOSIS — J441 Chronic obstructive pulmonary disease with (acute) exacerbation: Principal | ICD-10-CM | POA: Diagnosis present

## 2021-04-25 DIAGNOSIS — F17210 Nicotine dependence, cigarettes, uncomplicated: Secondary | ICD-10-CM | POA: Diagnosis not present

## 2021-04-25 DIAGNOSIS — J9691 Respiratory failure, unspecified with hypoxia: Secondary | ICD-10-CM | POA: Diagnosis not present

## 2021-04-25 DIAGNOSIS — Z720 Tobacco use: Secondary | ICD-10-CM

## 2021-04-25 DIAGNOSIS — Z7982 Long term (current) use of aspirin: Secondary | ICD-10-CM | POA: Insufficient documentation

## 2021-04-25 DIAGNOSIS — F1490 Cocaine use, unspecified, uncomplicated: Secondary | ICD-10-CM | POA: Diagnosis present

## 2021-04-25 DIAGNOSIS — I2510 Atherosclerotic heart disease of native coronary artery without angina pectoris: Secondary | ICD-10-CM | POA: Diagnosis not present

## 2021-04-25 DIAGNOSIS — I10 Essential (primary) hypertension: Secondary | ICD-10-CM

## 2021-04-25 DIAGNOSIS — R0602 Shortness of breath: Secondary | ICD-10-CM | POA: Diagnosis present

## 2021-04-25 DIAGNOSIS — Z87891 Personal history of nicotine dependence: Secondary | ICD-10-CM

## 2021-04-25 DIAGNOSIS — F1410 Cocaine abuse, uncomplicated: Secondary | ICD-10-CM | POA: Insufficient documentation

## 2021-04-25 DIAGNOSIS — Z20822 Contact with and (suspected) exposure to covid-19: Secondary | ICD-10-CM | POA: Diagnosis not present

## 2021-04-25 DIAGNOSIS — E785 Hyperlipidemia, unspecified: Secondary | ICD-10-CM

## 2021-04-25 DIAGNOSIS — Z79899 Other long term (current) drug therapy: Secondary | ICD-10-CM | POA: Diagnosis not present

## 2021-04-25 DIAGNOSIS — J449 Chronic obstructive pulmonary disease, unspecified: Secondary | ICD-10-CM | POA: Diagnosis present

## 2021-04-25 LAB — COMPREHENSIVE METABOLIC PANEL
ALT: 15 U/L (ref 0–44)
AST: 18 U/L (ref 15–41)
Albumin: 3.7 g/dL (ref 3.5–5.0)
Alkaline Phosphatase: 46 U/L (ref 38–126)
Anion gap: 6 (ref 5–15)
BUN: 22 mg/dL (ref 8–23)
CO2: 27 mmol/L (ref 22–32)
Calcium: 9.4 mg/dL (ref 8.9–10.3)
Chloride: 107 mmol/L (ref 98–111)
Creatinine, Ser: 0.83 mg/dL (ref 0.44–1.00)
GFR, Estimated: >60 mL/min (ref >60)
Glucose, Bld: 103 mg/dL — ABNORMAL HIGH (ref 70–99)
Potassium: 4 mmol/L (ref 3.5–5.1)
Sodium: 140 mmol/L (ref 135–145)
Total Bilirubin: 0.4 mg/dL (ref 0.3–1.2)
Total Protein: 7.6 g/dL (ref 6.5–8.1)

## 2021-04-25 LAB — TROPONIN I (HIGH SENSITIVITY): Troponin I (High Sensitivity): 17 ng/L (ref <18)

## 2021-04-25 LAB — CBC WITH DIFFERENTIAL/PLATELET
Abs Immature Granulocytes: 0 10*3/uL (ref 0.00–0.07)
Basophils Absolute: 0 10*3/uL (ref 0.0–0.1)
Basophils Relative: 0 %
Eosinophils Absolute: 0.1 10*3/uL (ref 0.0–0.5)
Eosinophils Relative: 1 %
HCT: 42.5 % (ref 36.0–46.0)
Hemoglobin: 14.1 g/dL (ref 12.0–15.0)
Immature Granulocytes: 0 %
Lymphocytes Relative: 41 %
Lymphs Abs: 2.3 10*3/uL (ref 0.7–4.0)
MCH: 31.8 pg (ref 26.0–34.0)
MCHC: 33.2 g/dL (ref 30.0–36.0)
MCV: 95.7 fL (ref 80.0–100.0)
Monocytes Absolute: 0.7 10*3/uL (ref 0.1–1.0)
Monocytes Relative: 12 %
Neutro Abs: 2.6 10*3/uL (ref 1.7–7.7)
Neutrophils Relative %: 46 %
Platelets: 335 10*3/uL (ref 150–400)
RBC: 4.44 MIL/uL (ref 3.87–5.11)
RDW: 12.2 % (ref 11.5–15.5)
WBC: 5.7 10*3/uL (ref 4.0–10.5)
nRBC: 0 % (ref 0.0–0.2)

## 2021-04-25 LAB — PROCALCITONIN: Procalcitonin: <0.1 ng/mL

## 2021-04-25 LAB — RESP PANEL BY RT-PCR (FLU A&B, COVID) ARPGX2
Influenza A by PCR: NEGATIVE
Influenza B by PCR: NEGATIVE
SARS Coronavirus 2 by RT PCR: NEGATIVE

## 2021-04-25 LAB — BRAIN NATRIURETIC PEPTIDE: B Natriuretic Peptide: 291.6 pg/mL — ABNORMAL HIGH (ref 0.0–100.0)

## 2021-04-25 MED ORDER — HYDRALAZINE HCL 20 MG/ML IJ SOLN: 5.0000 mg | INTRAMUSCULAR | Status: DC | PRN

## 2021-04-25 MED ORDER — IPRATROPIUM-ALBUTEROL 0.5-2.5 (3) MG/3ML IN SOLN
3.0000 mL | Freq: Once | RESPIRATORY_TRACT | Status: AC
  Administered 2021-04-25: 3 mL via RESPIRATORY_TRACT
  Filled 2021-04-25: qty 3

## 2021-04-25 MED ORDER — ONDANSETRON HCL 4 MG/2ML IJ SOLN: 4.0000 mg | Freq: Four times a day (QID) | INTRAMUSCULAR | Status: DC | PRN

## 2021-04-25 MED ORDER — PREDNISONE 20 MG PO TABS: 40.0000 mg | ORAL_TABLET | Freq: Every day | ORAL | Status: DC

## 2021-04-25 MED ORDER — IPRATROPIUM-ALBUTEROL 0.5-2.5 (3) MG/3ML IN SOLN
3.0000 mL | Freq: Four times a day (QID) | RESPIRATORY_TRACT | Status: DC
  Administered 2021-04-26: 3 mL via RESPIRATORY_TRACT
  Filled 2021-04-25: qty 3

## 2021-04-25 MED ORDER — ISOSORBIDE MONONITRATE ER 30 MG PO TB24
30.0000 mg | ORAL_TABLET | Freq: Every day | ORAL | Status: DC
  Administered 2021-04-26: 30 mg via ORAL
  Filled 2021-04-25: qty 1

## 2021-04-25 MED ORDER — METHYLPREDNISOLONE SODIUM SUCC 125 MG IJ SOLR
120.0000 mg | Freq: Once | INTRAMUSCULAR | Status: AC
  Administered 2021-04-26: 120 mg via INTRAVENOUS
  Filled 2021-04-25: qty 2

## 2021-04-25 MED ORDER — POLYETHYLENE GLYCOL 3350 17 G PO PACK: 17.0000 g | PACK | Freq: Every day | ORAL | Status: DC | PRN

## 2021-04-25 MED ORDER — ONDANSETRON HCL 4 MG PO TABS: 4.0000 mg | ORAL_TABLET | Freq: Four times a day (QID) | ORAL | Status: DC | PRN

## 2021-04-25 MED ORDER — ALBUTEROL SULFATE (2.5 MG/3ML) 0.083% IN NEBU: 2.5000 mg | INHALATION_SOLUTION | RESPIRATORY_TRACT | Status: DC | PRN

## 2021-04-25 MED ORDER — ASPIRIN EC 81 MG PO TBEC
81.0000 mg | DELAYED_RELEASE_TABLET | Freq: Every day | ORAL | Status: DC
  Administered 2021-04-26: 81 mg via ORAL
  Filled 2021-04-25: qty 1

## 2021-04-25 MED ORDER — SODIUM CHLORIDE 0.9% FLUSH
3.0000 mL | Freq: Two times a day (BID) | INTRAVENOUS | Status: DC
  Administered 2021-04-26: 3 mL via INTRAVENOUS

## 2021-04-25 MED ORDER — GUAIFENESIN ER 600 MG PO TB12: 600.0000 mg | ORAL_TABLET | Freq: Two times a day (BID) | ORAL | Status: DC | PRN

## 2021-04-25 MED ORDER — SODIUM CHLORIDE 0.9 % IV SOLN
2.0000 g | Freq: Three times a day (TID) | INTRAVENOUS | Status: DC
  Administered 2021-04-26 (×2): 2 g via INTRAVENOUS
  Filled 2021-04-25 (×5): qty 2

## 2021-04-25 MED ORDER — FLUTICASONE FUROATE-VILANTEROL 200-25 MCG/ACT IN AEPB
1.0000 | INHALATION_SPRAY | Freq: Every day | RESPIRATORY_TRACT | Status: DC
  Administered 2021-04-26: 1 via RESPIRATORY_TRACT
  Filled 2021-04-25 (×2): qty 28

## 2021-04-25 MED ORDER — ACETAMINOPHEN 650 MG RE SUPP: 650.0000 mg | Freq: Four times a day (QID) | RECTAL | Status: DC | PRN

## 2021-04-25 MED ORDER — METHYLPREDNISOLONE SODIUM SUCC 125 MG IJ SOLR
125.0000 mg | Freq: Once | INTRAMUSCULAR | Status: AC
  Administered 2021-04-26: 125 mg via INTRAVENOUS
  Filled 2021-04-25: qty 2

## 2021-04-25 MED ORDER — IPRATROPIUM-ALBUTEROL 0.5-2.5 (3) MG/3ML IN SOLN: 3.0000 mL | Freq: Four times a day (QID) | RESPIRATORY_TRACT | Status: DC

## 2021-04-25 MED ORDER — TIOTROPIUM BROMIDE MONOHYDRATE 18 MCG IN CAPS
18.0000 ug | ORAL_CAPSULE | Freq: Every day | RESPIRATORY_TRACT | Status: DC
  Administered 2021-04-26: 18 ug via RESPIRATORY_TRACT
  Filled 2021-04-25 (×2): qty 5

## 2021-04-25 MED ORDER — MORPHINE SULFATE (PF) 2 MG/ML IV SOLN: 2.0000 mg | INTRAVENOUS | Status: DC | PRN

## 2021-04-25 MED ORDER — ACETAMINOPHEN 325 MG PO TABS: 650.0000 mg | ORAL_TABLET | Freq: Four times a day (QID) | ORAL | Status: DC | PRN

## 2021-04-25 MED ORDER — LOSARTAN POTASSIUM 50 MG PO TABS
100.0000 mg | ORAL_TABLET | Freq: Every day | ORAL | Status: DC
  Administered 2021-04-26: 100 mg via ORAL
  Filled 2021-04-25: qty 2

## 2021-04-25 MED ORDER — ROSUVASTATIN CALCIUM 10 MG PO TABS
10.0000 mg | ORAL_TABLET | Freq: Every evening | ORAL | Status: DC
  Filled 2021-04-25 (×2): qty 1

## 2021-04-25 MED ORDER — SODIUM CHLORIDE 0.9 % IV SOLN
100.0000 mg | Freq: Once | INTRAVENOUS | Status: AC
  Administered 2021-04-25: 100 mg via INTRAVENOUS
  Filled 2021-04-25: qty 100

## 2021-04-25 MED ORDER — METOPROLOL TARTRATE 25 MG PO TABS
25.0000 mg | ORAL_TABLET | Freq: Two times a day (BID) | ORAL | Status: DC
  Administered 2021-04-26 (×2): 25 mg via ORAL
  Filled 2021-04-25 (×2): qty 1

## 2021-04-25 MED ORDER — ALBUTEROL SULFATE (2.5 MG/3ML) 0.083% IN NEBU: 3.0000 mL | INHALATION_SOLUTION | Freq: Four times a day (QID) | RESPIRATORY_TRACT | Status: DC | PRN

## 2021-04-25 MED ORDER — SODIUM CHLORIDE 0.9 % IV SOLN
100.0000 mg | Freq: Two times a day (BID) | INTRAVENOUS | Status: DC
  Administered 2021-04-26: 100 mg via INTRAVENOUS
  Filled 2021-04-25 (×2): qty 100

## 2021-04-25 MED ORDER — METHYLPREDNISOLONE SODIUM SUCC 125 MG IJ SOLR
125.0000 mg | Freq: Once | INTRAMUSCULAR | Status: AC
  Administered 2021-04-25: 125 mg via INTRAVENOUS
  Filled 2021-04-25: qty 2

## 2021-04-25 MED ORDER — DOCUSATE SODIUM 100 MG PO CAPS
100.0000 mg | ORAL_CAPSULE | Freq: Two times a day (BID) | ORAL | Status: DC
  Administered 2021-04-26 (×2): 100 mg via ORAL
  Filled 2021-04-25 (×2): qty 1

## 2021-04-25 MED ORDER — ZOLPIDEM TARTRATE 5 MG PO TABS: 5.0000 mg | ORAL_TABLET | Freq: Every evening | ORAL | Status: DC | PRN

## 2021-04-25 MED ORDER — IPRATROPIUM-ALBUTEROL 0.5-2.5 (3) MG/3ML IN SOLN: 3.0000 mL | Freq: Once | RESPIRATORY_TRACT | Status: DC

## 2021-04-25 MED ORDER — LACTATED RINGERS IV SOLN: INTRAVENOUS | Status: DC

## 2021-04-25 MED ORDER — NICOTINE 14 MG/24HR TD PT24
14.0000 mg | MEDICATED_PATCH | Freq: Every day | TRANSDERMAL | Status: DC
  Administered 2021-04-26 (×2): 14 mg via TRANSDERMAL
  Filled 2021-04-25 (×3): qty 1

## 2021-04-25 MED ORDER — ENOXAPARIN SODIUM 40 MG/0.4ML IJ SOSY
40.0000 mg | PREFILLED_SYRINGE | INTRAMUSCULAR | Status: DC
  Administered 2021-04-26: 40 mg via SUBCUTANEOUS
  Filled 2021-04-25: qty 0.4

## 2021-04-25 MED ORDER — BISACODYL 5 MG PO TBEC: 5.0000 mg | DELAYED_RELEASE_TABLET | Freq: Every day | ORAL | Status: DC | PRN

## 2021-04-25 NOTE — TOC Initial Note (Addendum)
Transition of Care [facility]) - Initial/Assessment Note    Patient Details  Name: Kristi Adams MRN: [id number] Date of Birth: 1956/03/30  [facility] CM/SW Contact:    Joseph Art, LCSWA Phone Number: 04/25/2021, 4:47 PM  Clinical Narrative:                  Patient presents to [facility] ED from [facility] due to SOB.  Patient told EDP her PC was ordering her O2.  EDP spoke with CSW about O2 referral.  CSW contacted patient's PCP and was told the social worker at the PCP office, had not made an O2 referral.  CSW updated EDP and ED RN and updated we can do a SAT evaluation here and get the O2 delivered to the patient's room, if she meets criteria.       Patient Goals and CMS Choice        Expected Discharge Plan and Services                                                Prior Living Arrangements/Services                       Activities of Daily Living      Permission Sought/Granted                  Emotional Assessment              Admission diagnosis:  [facility], EMS Patient Active Problem List   Diagnosis Date Noted   Personal history of tobacco use, presenting hazards to health 03/06/2017   PCP:  [facility] Pharmacy:  No Pharmacies Listed    Social Determinants of Health (SDOH) Interventions    Readmission Risk Interventions No flowsheet data found.

## 2021-04-25 NOTE — ED Triage Notes (Signed)
Pt ems from [facility] for SOB. Pt went to clinic for check-up s/p influenza. Pt was hospitalized 11/1-11/3 for same. Pt 94% on RA resting on stretcher, 90% RA s/p ambulation with audible wheezing.

## 2021-04-25 NOTE — ED Provider Notes (Signed)
[facility] Emergency Department Provider Note  ____________________________________________   Event Date/Time   First MD Initiated Contact with Patient 04/25/21 1511     (approximate)  I have reviewed the triage vital signs and the nursing notes.   HISTORY  Chief Complaint Shortness of Breath    HPI Kristi Adams is a 65 y.o. female with past medical history of hypertension, COPD, here with shortness of breath.  The patient states that she was hospitalized at [facility] for influenza earlier this month.  She states she had been recovering fairly well at home.  However, over the last 2 to 3 days, she has had progressive worsening cough, wheezing, and shortness of breath.  She went to her PCP today and was reportedly hypoxic, satting in the upper 80s on room air.  She states that she has been told multiple times during previous hospitalizations as well as PCP visits that she may need oxygen, but has never been set up with this.  She was hypoxic during her admission for influenza but has not been hypoxic since then.  She states that she currently feels mildly short of breath at rest but particularly with exertion.  She said cough.  She said yellow-green sputum production.  Denies any known fevers.  Denies any nausea or vomiting.  No known sick contacts.  No abdominal pain.    Past Medical History:  Diagnosis Date   COPD (chronic obstructive pulmonary disease) ([place])    Hypertension     Patient Active Problem List   Diagnosis Date Noted   Personal history of tobacco use, presenting hazards to health 03/06/2017    History reviewed. No pertinent surgical history.  Prior to Admission medications   Medication Sig Start Date End Date Taking? Authorizing Provider  albuterol (PROVENTIL HFA;VENTOLIN HFA) 108 (90 Base) MCG/ACT inhaler Inhale 1-2 puffs into the lungs every 6 (six) hours as needed for wheezing or shortness of breath.    [provider]   aspirin EC 81 MG tablet Take 81 mg by mouth daily.    [provider]  budesonide-formoterol (SYMBICORT) 160-4.5 MCG/ACT inhaler Inhale 2 puffs into the lungs 2 (two) times daily.    [provider]  isosorbide mononitrate (IMDUR) 30 MG 24 hr tablet Take 30 mg by mouth daily.    [provider]  losartan (COZAAR) 100 MG tablet Take 100 mg by mouth daily.    [provider]  metoprolol tartrate (LOPRESSOR) 25 MG tablet Take 25 mg by mouth 2 (two) times daily.    [provider]  omeprazole (PRILOSEC) 40 MG capsule Take 40 mg by mouth daily.    [provider]  predniSONE (DELTASONE) 20 MG tablet 3 tablets daily x 4 days 08/09/16   Paulette Blanch, MD  rosuvastatin (CRESTOR) 10 MG tablet Take 10 mg by mouth every evening.    [provider]  tiotropium (SPIRIVA) 18 MCG inhalation capsule Place 18 mcg into inhaler and inhale daily.    [provider]    Allergies Patient has no known allergies.  Family History  Problem Relation Age of Onset   Breast cancer Maternal Aunt    Breast cancer Cousin     Social History Social History   Tobacco Use   Smoking status: Every Day    Packs/day: 1.00    Years: 40.00    Pack years: 40.00    Types: Cigarettes   Smokeless tobacco: Never  Substance Use Topics   Alcohol use:  No    Review of Systems  Review of Systems  Constitutional:  Positive for fatigue. Negative for fever.  HENT:  Negative for congestion and sore throat.   Eyes:  Negative for visual disturbance.  Respiratory:  Positive for cough, shortness of breath and wheezing.   Cardiovascular:  Negative for chest pain.  Gastrointestinal:  Negative for abdominal pain, diarrhea, nausea and vomiting.  Genitourinary:  Negative for flank pain.  Musculoskeletal:  Negative for back pain and neck pain.  Skin:  Negative for rash and wound.  Neurological:  Positive for weakness.  All other systems reviewed and are negative.    ____________________________________________  PHYSICAL EXAM:      VITAL SIGNS: ED Triage Vitals  Enc Vitals Group     BP 04/25/21 1511 (!) 143/86     Pulse Rate 04/25/21 1511 60     Resp 04/25/21 1511 (!) 24     Temp 04/25/21 1511 98 F (36.7 C)     Temp Source 04/25/21 1511 Oral     SpO2 04/25/21 1511 93 %     Weight 04/25/21 1513 162 lb (73.5 kg)     Height 04/25/21 1513 5' (1.524 m)     Head Circumference --      Peak Flow --      Pain Score 04/25/21 1512 0     Pain Loc --      Pain Edu? --      Excl. in GC? --      Physical Exam Vitals and nursing note reviewed.  Constitutional:      General: She is not in acute distress.    Appearance: She is well-developed.  HENT:     Head: Normocephalic and atraumatic.  Eyes:     Conjunctiva/sclera: Conjunctivae normal.  Cardiovascular:     Rate and Rhythm: Normal rate and regular rhythm.     Heart sounds: Normal heart sounds. No murmur heard.   No friction rub.  Pulmonary:     Effort: Pulmonary effort is normal. No respiratory distress.     Breath sounds: Examination of the right-middle field reveals wheezing. Examination of the left-middle field reveals wheezing. Examination of the right-lower field reveals wheezing. Examination of the left-lower field reveals wheezing. Wheezing present. No rales.  Abdominal:     General: There is no distension.     Palpations: Abdomen is soft.     Tenderness: There is no abdominal tenderness.  Musculoskeletal:     Cervical back: Neck supple.  Skin:    General: Skin is warm.     Capillary Refill: Capillary refill takes less than 2 seconds.  Neurological:     Mental Status: She is alert and oriented to person, place, and time.     Motor: No abnormal muscle tone.      ____________________________________________   LABS (all labs ordered are listed, but only abnormal results are displayed)  Labs Reviewed  COMPREHENSIVE METABOLIC PANEL - Abnormal; Notable for the following  components:      Result Value   Glucose, Bld 103 (*)    All other components within normal limits  BRAIN NATRIURETIC PEPTIDE - Abnormal; Notable for the following components:   B Natriuretic Peptide 291.6 (*)    All other components within normal limits  CBC WITH DIFFERENTIAL/PLATELET  PROCALCITONIN  TROPONIN I (HIGH SENSITIVITY)  TROPONIN I (HIGH SENSITIVITY)    ____________________________________________  EKG: Normal sinus rhythm, ventricular at 57.  PR 176, QRS 172, QTc 476.  LVH with interventricular conduction  delay ________________________________________  RADIOLOGY All imaging, including plain films, CT scans, and ultrasounds, independently reviewed by me, and interpretations confirmed via formal radiology reads.  ED MD interpretation:   Chest x-ray: Fine groundglass opacities, nonspecific, small left effusion  Official radiology report(s): DG Chest 2 View  Result Date: 04/25/2021 CLINICAL DATA:  Short of breath EXAM: CHEST - 2 VIEW COMPARISON:  None. FINDINGS: Normal cardiac silhouette. Fine ground-glass opacities in the lung. Small effusions. No focal consolidation. No pneumothorax. No nodularity. IMPRESSION: Fine ground-glass densities is nonspecific. Findings similar to prior. Small LEFT effusion. Electronically Signed   By: Suzy Bouchard M.D.   On: 04/25/2021 15:57    ____________________________________________  PROCEDURES   Procedure(s) performed (including Critical Care):  .1-3 Lead EKG Interpretation Performed by: Duffy Bruce, MD Authorized by: Duffy Bruce, MD     Interpretation: normal     ECG rate:  60-80   ECG rate assessment: normal     Rhythm: sinus rhythm     Ectopy: none     Conduction: normal   Comments:     Indication: Shortness of breath  ____________________________________________  INITIAL IMPRESSION / MDM / ASSESSMENT AND PLAN / ED COURSE  As part of my medical decision making, I reviewed the following data within the  [place] notes reviewed and incorporated, Old chart reviewed, Notes from prior ED visits, and [place] Controlled Substance Database       *ALMEDINA NORTHCOTT was evaluated in Emergency Department on 04/25/2021 for the symptoms described in the history of present illness. She was evaluated in the context of the global COVID-19 pandemic, which necessitated consideration that the patient might be at risk for infection with the SARS-CoV-2 virus that causes COVID-19. Institutional protocols and algorithms that pertain to the evaluation of patients at risk for COVID-19 are in a state of rapid change based on information released by regulatory bodies including the CDC and federal and state organizations. These policies and algorithms were followed during the patient's care in the ED.  Some ED evaluations and interventions may be delayed as a result of limited staffing during the pandemic.*     Medical Decision Making: 65 year old female here with cough, wheezing, shortness of breath.  Suspect acute on chronic COPD exacerbation with mild hypoxia.  Chest x-ray shows fine groundglass densities likely related to chronic bronchitis.  CBC without significant leukocytosis or anemia.  CMP unremarkable.  BNP slightly elevated but she does not appear overtly fluid overloaded on exam.  Troponin is 17.  EKG nonischemic.  Patient denies any chest pain.  Patient given steroids and breathing treatments.  Following this, she remains intermittently hypoxic to 87-88 on room air, even at rest.  She does not have oxygen at home.  Will admit for further treatment and possible arrangement of oxygen at home as she reportedly has been told she needs this in the past.  No signs of impending respiratory failure at this time.  Stable for floor.  ____________________________________________  FINAL CLINICAL IMPRESSION(S) / ED DIAGNOSES  Final diagnoses:  COPD exacerbation ([place])     MEDICATIONS GIVEN DURING THIS  VISIT:  Medications  ipratropium-albuterol (DUONEB) 0.5-2.5 (3) MG/3ML nebulizer solution 3 mL (3 mLs Nebulization Given 04/25/21 1557)  ipratropium-albuterol (DUONEB) 0.5-2.5 (3) MG/3ML nebulizer solution 3 mL (3 mLs Nebulization Given 04/25/21 1557)  ipratropium-albuterol (DUONEB) 0.5-2.5 (3) MG/3ML nebulizer solution 3 mL (3 mLs Nebulization Given 04/25/21 1557)  methylPREDNISolone sodium succinate (SOLU-MEDROL) 125 mg/2 mL injection 125 mg (125 mg Intravenous Given  04/25/21 1554)     ED Discharge Orders     None        Note:  This document was prepared using Dragon voice recognition software and may include unintentional dictation errors.   Duffy Bruce, MD 04/25/21 [phone]

## 2021-04-25 NOTE — H&P (Signed)
History and Physical    Kristi Adams:[id number] DOB: [birth year] DOA: 04/25/2021  PC[facility]: [facility]    Patient coming from:  Home   Chief Complaint:  Shortness of breath   HPI:  Kristi Adams is a 65 y.o. female seen in ed with complaints of shortness of breath.  Patient was hospitalized with influenza earlier this month and has been doing well except for the past 2 to 3 days she has been noticing dyspnea on exertion progressive shortness of breath cough wheezing.  When she went to her PCP she was found to be hypoxic with O2 sats in the 80% patient states that she was told that they suspect that she will need continuous oxygen for her COPD.  Patient denies any chest pain, palpitations headaches blurred vision speech gait fevers chills abdominal pain urinary or bladder complaints or any bowel issues.  When asked what brought her here she said shortness of breath.   Pt has past medical history of tobacco abuse, COPD, hypertension.  ED Course: In the emergency room patient is alert awake oriented not in distress, stable at room air but wheezing. Vitals:   04/25/21 1515 04/25/21 1730 04/25/21 1819 04/25/21 1928  BP:  121/70  126/71  Pulse: (!) 59 61  73  Resp: 19 (!) 23  19  Temp:      TempSrc:      SpO2: 90% 93% (!) 88% 95%  Weight:      Height:      Initial blood work shows CMP normal CBC.  BNP 91.6, procalcitonin less than 10, troponin is 17. Respiratory panel negative for flu and COVID.  Initial EKG shows sinus tach 57 with LVH.  T wave inversions in lead II, III, aVF, V4 V5 and V6.  Review of Systems:  Review of Systems  Respiratory:         Low oxygen.   All other systems reviewed and are negative.   Past Medical History:  Diagnosis Date   COPD (chronic obstructive pulmonary disease) (HCC)    Hypertension     History reviewed. No pertinent surgical history.   reports that she has been smoking cigarettes. She has a 40.00 pack-year smoking  history. She has never used smokeless tobacco. She reports that she does not drink alcohol. No history on file for drug use.  No Known Allergies  Family History  Problem Relation Age of Onset   Breast cancer Maternal Aunt    Breast cancer Cousin     Prior to Admission medications   Medication Sig Start Date End Date Taking? Authorizing Provider  albuterol (PROVENTIL HFA;VENTOLIN HFA) 108 (90 Base) MCG/ACT inhaler Inhale 1-2 puffs into the lungs every 6 (six) hours as needed for wheezing or shortness of breath.   Yes [provider]  aspirin EC 81 MG tablet Take 81 mg by mouth daily.   Yes [provider]  budesonide-formoterol (SYMBICORT) 160-4.5 MCG/ACT inhaler Inhale 2 puffs into the lungs 2 (two) times daily.   Yes [provider]  isosorbide mononitrate (IMDUR) 30 MG 24 hr tablet Take 30 mg by mouth daily.   Yes [provider]  losartan (COZAAR) 100 MG tablet Take 100 mg by mouth daily.   Yes [provider]  metoprolol tartrate (LOPRESSOR) 25 MG tablet Take 25 mg by mouth 2 (two) times daily.   Yes [provider]  rosuvastatin (CRESTOR) 10 MG tablet Take 10 mg by mouth every evening.   Yes [provider]  tiotropium (SPIRIVA) 18 MCG inhalation capsule Place 18 mcg into inhaler and inhale daily.   Yes [provider]  omeprazole (PRILOSEC) 40 MG capsule Take 40 mg by mouth daily. Patient not taking: Reported on 04/25/2021    [provider]  predniSONE (DELTASONE) 20 MG tablet 3 tablets daily x 4 days Patient not taking: No sig reported 08/09/16   Paulette Blanch, MD    Physical Exam: Vitals:   04/25/21 1515 04/25/21 1730 04/25/21 1819 04/25/21 1928  BP:  121/70  126/71  Pulse: (!) 59 61  73  Resp: 19 (!) 23  19  Temp:      TempSrc:      SpO2: 90% 93% (!) 88% 95%  Weight:      Height:       Physical Exam Vitals and nursing note reviewed.  Constitutional:      General: She is not in acute  distress.    Appearance: Normal appearance. She is not ill-appearing, toxic-appearing or diaphoretic.  HENT:     Head: Normocephalic and atraumatic.     Right Ear: External ear normal.     Left Ear: External ear normal.     Nose: Nose normal.     Mouth/Throat:     Mouth: Mucous membranes are moist.  Eyes:     Extraocular Movements: Extraocular movements intact.     Pupils: Pupils are equal, round, and reactive to light.  Neck:     Vascular: No carotid bruit.  Cardiovascular:     Rate and Rhythm: Normal rate and regular rhythm.     Pulses: Normal pulses.     Heart sounds: Normal heart sounds.  Pulmonary:     Effort: Pulmonary effort is normal.     Breath sounds: Wheezing present.  Abdominal:     General: Bowel sounds are normal. There is no distension.     Palpations: Abdomen is soft. There is no mass.     Tenderness: There is no abdominal tenderness. There is no guarding.     Hernia: No hernia is present.  Musculoskeletal:     Right lower leg: No edema.     Left lower leg: No edema.  Skin:    General: Skin is warm.  Neurological:     General: No focal deficit present.     Mental Status: She is alert and oriented to person, place, and time.  Psychiatric:        Mood and Affect: Mood normal.        Behavior: Behavior normal.     Labs on Admission: I have personally reviewed following labs and imaging studies  No results for input(s): CKTOTAL, CKMB, TROPONINI in the last 72 hours. Lab Results  Component Value Date   WBC 5.7 04/25/2021   HGB 14.1 04/25/2021   HCT 42.5 04/25/2021   MCV 95.7 04/25/2021   PLT 335 04/25/2021    Recent Labs  Lab 04/25/21 1528  NA 140  K 4.0  CL 107  CO2 27  BUN 22  CREATININE 0.83  CALCIUM 9.4  PROT 7.6  BILITOT 0.4  ALKPHOS 46  ALT 15  AST 18  GLUCOSE 103*   Lab Results  Component Value Date   CHOL 197 12/24/2013   HDL 41 12/24/2013   LDLCALC 136 (H) 12/24/2013   TRIG 102 12/24/2013   No results found for:  DDIMER Invalid input(s): POCBNP   COVID-19 Labs No results for input(s): DDIMER, FERRITIN, LDH, CRP in the  last 72 hours. Lab Results  Component Value Date   [place] NEGATIVE 04/25/2021   [place] Not Detected 06/10/2019   [place] Not Detected 04/08/2019    Radiological Exams on Admission: DG Chest 2 View  Result Date: 04/25/2021 CLINICAL DATA:  Short of breath EXAM: CHEST - 2 VIEW COMPARISON:  None. FINDINGS: Normal cardiac silhouette. Fine ground-glass opacities in the lung. Small effusions. No focal consolidation. No pneumothorax. No nodularity. IMPRESSION: Fine ground-glass densities is nonspecific. Findings similar to prior. Small LEFT effusion. Electronically Signed   By: Suzy Bouchard M.D.   On: 04/25/2021 15:57    EKG: Independently reviewed.  Initial EKG shows sinus tach 57 with LVH.  T wave inversions in lead II, III, aVF, V4 V5 and V6.  echocardiogram: None  Assessment/Plan: Principal Problem:   Shortness of breath Active Problems:   Respiratory failure with hypoxia (HCC)   COPD (chronic obstructive pulmonary disease) (HCC)   Personal history of tobacco use, presenting hazards to health   Hypertension   Coronary artery disease   Crack cocaine use   Shortness of breath/respiratory failure with hypoxia: Will admit patient to telemetry unit with continuous cardiac monitoring and pulse oximetry. Differentials for etiology include COPD exacerbation, CHF. We will continue patient on DuoNeb, albuterol as needed, Solu-Medrol with p.o. taper to prednisone.  Consider echocardiogram tomorrow morning if repeat BNP is elevated or if patient is clinically still short of breath or hypoxic.  COPD/history of tobacco abuse: We will continue patient albuterol, Breo Ellipta, tiotropium. Nicotine patch.  We will continue Spiriva.  Hypertension: Blood pressure 126/71, pulse 73, temperature 98 F (36.7 C), temperature source Oral, resp. rate 19, height 5' (1.524 m),  weight 73.5 kg, SpO2 95 %. Continue patient on losartan, metoprolol.  CAD/history of cocaine use: We will get a drug screen today. Abnormal EKG we will follow troponin trend.   DVT prophylaxis:  Heparin  Code Status:  DO NOT RESUSCITATE  Family Communication:  Chantina, [place] (Son)  [phone] [facility] Phone)   Disposition Plan:  Home  Consults called:  None  Admission status: Inpatient   Para Skeans MD [facility] [phone] How to contact the [facility] Attending or Consulting provider [place] or covering provider during after hours [place], for this patient.    Check the care team in [facility] and look for a) attending/consulting [facility] provider listed and b) the [facility] team listed Log into [email or web address] and use [place]'s universal password to access. If you do not have the password, please contact the hospital operator. Locate the [facility] provider you are looking for under [facility] and page to a number that you can be directly reached. If you still have difficulty reaching the provider, please page the [facility] (Director on Call) for the Hospitalists listed on amion for assistance. [email or web address] Password [email or web address] 04/25/2021, 11:10 PM

## 2021-04-26 DIAGNOSIS — Z720 Tobacco use: Secondary | ICD-10-CM

## 2021-04-26 DIAGNOSIS — I10 Essential (primary) hypertension: Secondary | ICD-10-CM | POA: Diagnosis not present

## 2021-04-26 DIAGNOSIS — J441 Chronic obstructive pulmonary disease with (acute) exacerbation: Secondary | ICD-10-CM | POA: Diagnosis present

## 2021-04-26 DIAGNOSIS — E785 Hyperlipidemia, unspecified: Secondary | ICD-10-CM

## 2021-04-26 DIAGNOSIS — F1490 Cocaine use, unspecified, uncomplicated: Secondary | ICD-10-CM | POA: Diagnosis not present

## 2021-04-26 DIAGNOSIS — J9601 Acute respiratory failure with hypoxia: Secondary | ICD-10-CM | POA: Diagnosis not present

## 2021-04-26 LAB — BASIC METABOLIC PANEL
Anion gap: 7 (ref 5–15)
BUN: 19 mg/dL (ref 8–23)
CO2: 22 mmol/L (ref 22–32)
Calcium: 9.2 mg/dL (ref 8.9–10.3)
Chloride: 109 mmol/L (ref 98–111)
Creatinine, Ser: 0.72 mg/dL (ref 0.44–1.00)
GFR, Estimated: >60 mL/min (ref >60)
Glucose, Bld: 137 mg/dL — ABNORMAL HIGH (ref 70–99)
Potassium: 4.4 mmol/L (ref 3.5–5.1)
Sodium: 138 mmol/L (ref 135–145)

## 2021-04-26 LAB — CBC
HCT: 39.3 % (ref 36.0–46.0)
Hemoglobin: 13.5 g/dL (ref 12.0–15.0)
MCH: 32.7 pg (ref 26.0–34.0)
MCHC: 34.4 g/dL (ref 30.0–36.0)
MCV: 95.2 fL (ref 80.0–100.0)
Platelets: 281 10*3/uL (ref 150–400)
RBC: 4.13 MIL/uL (ref 3.87–5.11)
RDW: 12.2 % (ref 11.5–15.5)
WBC: 6.4 10*3/uL (ref 4.0–10.5)
nRBC: 0 % (ref 0.0–0.2)

## 2021-04-26 LAB — URINE DRUG SCREEN, QUALITATIVE (ARMC ONLY)
Amphetamines, Ur Screen: NOT DETECTED
Barbiturates, Ur Screen: NOT DETECTED
Benzodiazepine, Ur Scrn: NOT DETECTED
Cannabinoid 50 Ng, Ur ~~LOC~~: NOT DETECTED
Cocaine Metabolite,Ur ~~LOC~~: POSITIVE — AB
MDMA (Ecstasy)Ur Screen: NOT DETECTED
Methadone Scn, Ur: NOT DETECTED
Opiate, Ur Screen: NOT DETECTED
Phencyclidine (PCP) Ur S: NOT DETECTED
Tricyclic, Ur Screen: NOT DETECTED

## 2021-04-26 LAB — HIV ANTIBODY (ROUTINE TESTING W REFLEX): HIV Screen 4th Generation wRfx: NONREACTIVE

## 2021-04-26 MED ORDER — DOXYCYCLINE HYCLATE 100 MG PO CAPS: 100.0000 mg | ORAL_CAPSULE | Freq: Two times a day (BID) | ORAL | 0 refills | Status: AC

## 2021-04-26 MED ORDER — NICOTINE 14 MG/24HR TD PT24: MEDICATED_PATCH | TRANSDERMAL | 0 refills | Status: AC

## 2021-04-26 MED ORDER — PREDNISONE 20 MG PO TABS: 40.0000 mg | ORAL_TABLET | Freq: Every day | ORAL | 0 refills | Status: DC

## 2021-04-26 MED ORDER — INFLUENZA VAC SPLIT QUAD 0.5 ML IM SUSY: 0.5000 mL | PREFILLED_SYRINGE | INTRAMUSCULAR | Status: DC

## 2021-04-26 NOTE — Progress Notes (Signed)
Nutrition Brief Note  RD consulted for nutritional goals.  Wt Readings from Last 15 Encounters:  04/25/21 73.5 kg  02/12/20 80.3 kg  03/06/17 78 kg  08/08/16 78 kg   Kristi Adams is a 65 y.o. female seen in ed with complaints of shortness of breath.  Patient was hospitalized with influenza earlier this month and has been doing well except for the past 2 to 3 days she has been noticing dyspnea on exertion progressive shortness of breath cough wheezing.  When she went to her PCP she was found to be hypoxic with O2 sats in the 80% patient states that she was told that they suspect that she will need continuous oxygen for her COPD.  Patient denies any chest pain, palpitations headaches blurred vision speech gait fevers chills abdominal pain urinary or bladder complaints or any bowel issues.  When asked what brought her here she said shortness of breath.  Pt admitted with COPD exacerbation.   Reviewed wt hx; pt has experienced a 8.3% wt loss over the past 14 months, which is not significant for time frame.   Obesity is a complex, chronic medical condition that is optimally managed by a multidisciplinary care team. Weight loss is not an ideal goal for an acute inpatient hospitalization. However, if further work-up for obesity is warranted, consider outpatient referral to outpatient bariatric service and/or [facility].    Per MD, plan for discharge home today.   Labs reviewed.  Body mass index is 31.64 kg/m. Patient meets criteria for obesity, class I based on current BMI.   Current diet order is heart healthy/ carb modified, patient is consuming approximately n/a% of meals at this time. Labs and medications reviewed.   No nutrition interventions warranted at this time. If nutrition issues arise, please consult RD.   Levada Schilling, RD, LDN, CDCES Registered Dietitian II Certified Diabetes Care and Education Specialist Please refer to [facility] for RD  and/or RD on-call/weekend/after hours pager

## 2021-04-26 NOTE — Discharge Summary (Signed)
[facility] [place] [facility]   PATIENT NAME: Kristi Adams    MR#:  [id number]  DATE OF BIRTH:  03-Sep-1955  DATE OF ADMISSION:  04/25/2021 ADMITTING PHYSICIAN: Alford Highland, MD  DATE OF DISCHARGE: 04/26/2021  2:17 PM  PRIMARY CARE PHYSICIAN: [facility], [facility]    ADMISSION DIAGNOSIS:  COPD exacerbation (HCC) [J44.1] Respiratory failure with hypoxia (HCC) [J96.91]  DISCHARGE DIAGNOSIS:  Principal Problem:   Shortness of breath Active Problems:   Personal history of tobacco use, presenting hazards to health   Respiratory failure with hypoxia (HCC)   COPD (chronic obstructive pulmonary disease) (HCC)   Crack cocaine use   Coronary artery disease   Hypertension   COPD exacerbation (HCC)   SECONDARY DIAGNOSIS:   Past Medical History:  Diagnosis Date   COPD (chronic obstructive pulmonary disease) (HCC)    Hypertension     HOSPITAL COURSE:   1.  COPD exacerbation.  Patient was given Solu-Medrol 125 mg once on admission and once again prior to disposition.  We will give prednisone 40 mg for 3 more days upon discharge.  Empirically prescribed doxycycline upon disposition.  Patient has her inhalers and nebulizers at home.  Patient interested in going home today. 2.  Acute hypoxic respiratory failure.  On presentation was documented 88% with ambulation.  Today with ambulation patient was saturating 94%.  She does not qualify for home oxygen at this time. 3.  Cocaine found in urine toxicology.  I advised the patient that this could worsen respiratory status and even have worse complications. 4.  Essential hypertension on losartan and metoprolol 5.  Hyperlipidemia unspecified on Crestor 6.  Tobacco abuse on nicotine patch 7.  The patient had recent influenza A infection on 04/12/2021.  The patient was not admitted for this and this was not an active problem.  The patient did want a flu shot and I would rather wait a little longer and have her  follow-up with PMD rather giving a flu shot right now.  DISCHARGE CONDITIONS:   Satisfactory  CONSULTS OBTAINED:    None  DRUG ALLERGIES:  No Known Allergies  DISCHARGE MEDICATIONS:   Allergies as of 04/26/2021   No Known Allergies      Medication List     TAKE these medications    albuterol 108 (90 Base) MCG/ACT inhaler Commonly known as: VENTOLIN HFA Inhale 1-2 puffs into the lungs every 6 (six) hours as needed for wheezing or shortness of breath.   aspirin EC 81 MG tablet Take 81 mg by mouth daily.   budesonide-formoterol 160-4.5 MCG/ACT inhaler Commonly known as: SYMBICORT Inhale 2 puffs into the lungs 2 (two) times daily.   doxycycline 100 MG capsule Commonly known as: VIBRAMYCIN Take 1 capsule (100 mg total) by mouth 2 (two) times daily for 6 days.   isosorbide mononitrate 30 MG 24 hr tablet Commonly known as: IMDUR Take 30 mg by mouth daily.   losartan 100 MG tablet Commonly known as: COZAAR Take 100 mg by mouth daily.   metoprolol tartrate 25 MG tablet Commonly known as: LOPRESSOR Take 25 mg by mouth 2 (two) times daily.   nicotine 14 mg/24hr patch Commonly known as: NICODERM CQ - dosed in mg/24 hours One patch chest wall daily (okay to substitute generic)   predniSONE 20 MG tablet Commonly known as: DELTASONE Take 2 tablets (40 mg total) by mouth daily with breakfast. Start taking on: April 27, 2021   rosuvastatin 10 MG tablet Commonly known as:  CRESTOR Take 10 mg by mouth every evening.   tiotropium 18 MCG inhalation capsule Commonly known as: SPIRIVA Place 18 mcg into inhaler and inhale daily.         DISCHARGE INSTRUCTIONS:   Follow-up PMD 5 days  If you experience worsening of your admission symptoms, develop shortness of breath, life threatening emergency, suicidal or homicidal thoughts you must seek medical attention immediately by calling 911 or calling your MD immediately  if symptoms less severe.  You Must read  complete instructions/literature along with all the possible adverse reactions/side effects for all the Medicines you take and that have been prescribed to you. Take any new Medicines after you have completely understood and accept all the possible adverse reactions/side effects.   Please note  You were cared for by a hospitalist during your hospital stay. If you have any questions about your discharge medications or the care you received while you were in the hospital after you are discharged, you can call the unit and asked to speak with the hospitalist on call if the hospitalist that took care of you is not available. Once you are discharged, your primary care physician will handle any further medical issues. Please note that NO REFILLS for any discharge medications will be authorized once you are discharged, as it is imperative that you return to your primary care physician (or establish a relationship with a primary care physician if you do not have one) for your aftercare needs so that they can reassess your need for medications and monitor your lab values.    Today   CHIEF COMPLAINT:   Chief Complaint  Patient presents with   Shortness of Breath    HISTORY OF PRESENT ILLNESS:  Kristi Adams  is a 65 y.o. female coming in with shortness of breath   VITAL SIGNS:  Blood pressure 122/60, pulse 64, temperature 97.8 F (36.6 C), temperature source Oral, resp. rate 20, height 5' (1.524 m), weight 73.5 kg, SpO2 96 %.   PHYSICAL EXAMINATION:  GENERAL:  65 y.o.-year-old patient lying in the bed with no acute distress.  EYES: Pupils equal, round, reactive to light and accommodation. No scleral icterus.  HEENT: Head atraumatic, normocephalic. Oropharynx and nasopharynx clear.  LUNGS: decreased breath sounds bilateral bases, no wheezing, rales,rhonchi or crepitation. No use of accessory muscles of respiration.  CARDIOVASCULAR: S1, S2 normal. No murmurs, rubs, or gallops.  ABDOMEN: Soft,  non-tender, non-distended.  EXTREMITIES: No pedal edema.  NEUROLOGIC: Cranial nerves II through XII are intact. Muscle strength 5/5 in all extremities. Sensation intact. Gait not checked.  PSYCHIATRIC: The patient is alert and oriented x3.  SKIN: No obvious rash, lesion, or ulcer.   DATA REVIEW:   CBC Recent Labs  Lab 04/26/21 0721  WBC 6.4  HGB 13.5  HCT 39.3  PLT 281    Chemistries  Recent Labs  Lab 04/25/21 1528 04/26/21 0721  NA 140 138  K 4.0 4.4  CL 107 109  CO2 27 22  GLUCOSE 103* 137*  BUN 22 19  CREATININE 0.83 0.72  CALCIUM 9.4 9.2  AST 18  --   ALT 15  --   ALKPHOS 46  --   BILITOT 0.4  --      Microbiology Results  Results for orders placed or performed during the hospital encounter of 04/25/21  Resp Panel by RT-PCR (Flu A&B, Covid) Nasopharyngeal Swab     Status: None   Collection Time: 04/25/21  7:48 PM   Specimen: Nasopharyngeal Swab;  Nasopharyngeal(NP) swabs in vial transport medium  Result Value Ref Range Status   SARS Coronavirus 2 by RT PCR NEGATIVE NEGATIVE Final    Comment: (NOTE) SARS-CoV-2 target nucleic acids are NOT DETECTED.  The SARS-CoV-2 RNA is generally detectable in upper respiratory specimens during the acute phase of infection. The lowest concentration of SARS-CoV-2 viral copies this assay can detect is 138 copies/mL. A negative result does not preclude SARS-Cov-2 infection and should not be used as the sole basis for treatment or other patient management decisions. A negative result may occur with  improper specimen collection/handling, submission of specimen other than nasopharyngeal swab, presence of viral mutation(s) within the areas targeted by this assay, and inadequate number of viral copies(<138 copies/mL). A negative result must be combined with clinical observations, patient history, and epidemiological information. The expected result is Negative.  Fact Sheet for Patients:   BloggerCourse.com  Fact Sheet for Healthcare Providers:  SeriousBroker.it  This test is no t yet approved or cleared by the [place] FDA and  has been authorized for detection and/or diagnosis of SARS-CoV-2 by FDA under an Emergency Use Authorization (EUA). This EUA will remain  in effect (meaning this test can be used) for the duration of the COVID-19 declaration under Section 564(b)(1) of the Act, 21 U.S.C.section 360bbb-3(b)(1), unless the authorization is terminated  or revoked sooner.       Influenza A by PCR NEGATIVE NEGATIVE Final   Influenza B by PCR NEGATIVE NEGATIVE Final    Comment: (NOTE) The Xpert Xpress SARS-CoV-2/FLU/RSV plus assay is intended as an aid in the diagnosis of influenza from Nasopharyngeal swab specimens and should not be used as a sole basis for treatment. Nasal washings and aspirates are unacceptable for Xpert Xpress SARS-CoV-2/FLU/RSV testing.  Fact Sheet for Patients: BloggerCourse.com  Fact Sheet for Healthcare Providers: SeriousBroker.it  This test is not yet approved or cleared by the [place] FDA and has been authorized for detection and/or diagnosis of SARS-CoV-2 by FDA under an Emergency Use Authorization (EUA). This EUA will remain in effect (meaning this test can be used) for the duration of the COVID-19 declaration under Section 564(b)(1) of the Act, 21 U.S.C. section 360bbb-3(b)(1), unless the authorization is terminated or revoked.  Performed at [facility], [address] [place], [place] [address]     RADIOLOGY:  DG Chest 2 View  Result Date: 04/25/2021 CLINICAL DATA:  Short of breath EXAM: CHEST - 2 VIEW COMPARISON:  None. FINDINGS: Normal cardiac silhouette. Fine ground-glass opacities in the lung. Small effusions. No focal consolidation. No pneumothorax. No nodularity. IMPRESSION: Fine ground-glass  densities is nonspecific. Findings similar to prior. Small LEFT effusion. Electronically Signed   By: Genevive Bi M.D.   On: 04/25/2021 15:57         Management plans discussed with the patient, and she is in agreement.  CODE STATUS:     Code Status Orders  (From admission, onward)           Start     Ordered   04/25/21 2112  Do not attempt resuscitation (DNR)  Continuous       Question Answer Comment  In the event of cardiac or respiratory ARREST Do not call a "code blue"   In the event of cardiac or respiratory ARREST Do not perform Intubation, CPR, defibrillation or ACLS   In the event of cardiac or respiratory ARREST Use medication by any route, position, wound care, and other measures to relive pain and suffering. May  use oxygen, suction and manual treatment of airway obstruction as needed for comfort.      04/25/21 2114           Code Status History     This patient has a current code status but no historical code status.       TOTAL TIME TAKING CARE OF THIS PATIENT: 34 minutes.    Alford Highland M.D on 04/26/2021 at 4:11 PM   [facility]  CC: Primary care physician; [facility]

## 2021-04-26 NOTE — Progress Notes (Signed)
PT Cancellation Note  Patient Details Name: Kristi Adams MRN: [id number] DOB: [birth year]   Cancelled Treatment:    Reason Eval/Treat Not Completed: PT screened, no needs identified, will sign off; Per OT pt at baseline with no skilled needs at this time. Per conversation with patient, pt concurs and stated that she is at her functional baseline and requested no PT services at this time.  Will complete PT orders at this time but will reassess pt pending a change in status upon receipt of new PT orders.    Ovidio Hanger PT, DPT 04/26/21, 10:36 AM

## 2021-04-26 NOTE — TOC Progression Note (Signed)
Transition of Care [facility]) - Progression Note    Patient Details  Name: Kristi Adams MRN: [id number] Date of Birth: 1956/04/24  Transition of [facility]) CM/SW Contact  Marlowe Sax, RN Phone Number: 04/26/2021, 10:04 AM  Clinical Narrative:    Patient ambulated well and did not desat belw 88% with walking so therefore does not qualify for Home Oxygen, Code 44 completed        Expected Discharge Plan and Services           Expected Discharge Date: 04/26/21                                     Social Determinants of Health (SDOH) Interventions    Readmission Risk Interventions No flowsheet data found.

## 2021-04-26 NOTE — Care Management CC44 (Signed)
Condition Code 44 Documentation Completed  Patient Details  Name: LENEE FRANZE MRN: [id number] Date of Birth: Nov 05, 1955   Condition Code 44 given:  Yes Patient signature on Condition Code 44 notice:  Yes Documentation of 2 MD's agreement:  Yes Code 44 added to claim:  Yes    Marlowe Sax, RN 04/26/2021, 10:09 AM

## 2021-04-26 NOTE — Plan of Care (Signed)
  Problem: Education: Goal: Knowledge of General Education information will improve Description: Including pain rating scale, medication(s)/side effects and non-pharmacologic comfort measures Outcome: Progressing   Problem: Health Behavior/Discharge Planning: Goal: Ability to manage health-related needs will improve Outcome: Progressing   Problem: Activity: Goal: Risk for activity intolerance will decrease Outcome: Progressing   

## 2021-04-26 NOTE — Evaluation (Signed)
Occupational Therapy Evaluation Patient Details Name: Kristi Adams MRN: [id number] DOB: 02/28/1956 Today's Date: 04/26/2021   History of Present Illness 65 y.o. female seen in ed with complaints of shortness of breath.  Patient was hospitalized with influenza earlier this month and has been doing well except for the past 2 to 3 days she has been noticing dyspnea on exertion progressive shortness of breath cough wheezing.  When she went to her PCP she was found to be hypoxic with O2 sats in the 80% patient states that she was told that they suspect that she will need continuous oxygen for her COPD.   Clinical Impression   Upon entering the room, pt supine in bed and agreeable to OT intervention. Pt reports living at home independently at baseline for all self care, mobility, and IADL tasks. Pt has grandson that stays with her on occasion. She does not use AD at baseline and drives. Pt demonstrates ability to perform self care tasks and functional ambulation of 300' without use of AD at independent level. No LOB and pt O2 saturation remains at 95% on RA with ambulation. Pt with no skilled OT needs at this time. OT to SIGN OFF.      Recommendations for follow up therapy are one component of a multi-disciplinary discharge planning process, led by the attending physician.  Recommendations may be updated based on patient status, additional functional criteria and insurance authorization.   Follow Up Recommendations  No OT follow up    Assistance Recommended at Discharge PRN  Functional Status Assessment  Patient has not had a recent decline in their functional status  Equipment Recommendations  None recommended by OT       Precautions / Restrictions Precautions Precautions: None      Mobility Bed Mobility Overal bed mobility: Independent                  Transfers Overall transfer level: Independent Equipment used: None                      Balance Overall balance  assessment: Independent                                         ADL either performed or assessed with clinical judgement   ADL Overall ADL's : Independent                                       General ADL Comments: no physical assistance provided     Vision Patient Visual Report: No change from baseline              Pertinent Vitals/Pain Pain Assessment: No/denies pain     Hand Dominance Right   Extremity/Trunk Assessment Upper Extremity Assessment Upper Extremity Assessment: Overall WFL for tasks assessed   Lower Extremity Assessment Lower Extremity Assessment: Overall WFL for tasks assessed       Communication Communication Communication: No difficulties   Cognition Arousal/Alertness: Awake/alert Behavior During Therapy: WFL for tasks assessed/performed Overall Cognitive Status: Within Functional Limits for tasks assessed  Home Living Family/patient expects to be discharged to:: Private residence Living Arrangements: Children Available Help at Discharge: Family;Available PRN/intermittently Type of Home: House Home Access: Stairs to enter [employer] of Steps: 5 Entrance Stairs-Rails: Left;Right;Can reach both Home Layout: One level     Bathroom Shower/Tub: Tub/shower unit         Home Equipment: None          Prior Functioning/Environment Prior Level of Function : Independent/Modified Independent             Mobility Comments: Pt is ind at baseline without use of AD ADLs Comments: Pt is independent with all aspects of self care and IADLs                 OT Goals(Current goals can be found in the care plan section) Acute Rehab OT Goals Patient Stated Goal: to return home OT Goal Formulation: With patient Time For Goal Achievement: 04/26/21 Potential to Achieve Goals: Good  OT Frequency:     Barriers to D/C:    none  known at this time          AM-PAC OT "6 Clicks" Daily Activity     Outcome Measure Help from another person eating meals?: None Help from another person taking care of personal grooming?: None Help from another person toileting, which includes using toliet, bedpan, or urinal?: None Help from another person bathing (including washing, rinsing, drying)?: None Help from another person to put on and taking off regular upper body clothing?: None Help from another person to put on and taking off regular lower body clothing?: None 6 Click Score: 24   End of Session Nurse Communication: Mobility status  Activity Tolerance: Patient tolerated treatment well Patient left: in bed;with call bell/phone within reach;Other (comment) (MD present)                   Time: 5638-9373 OT Time Calculation (min): 15 min Charges:  OT General Charges $OT Visit: 1 Visit OT Evaluation $OT Eval Low Complexity: 1 Low OT Treatments $Self Care/Home Management : 8-22 mins  Jackquline Denmark, MS, OTR/L , CBIS ascom [phone]  04/26/21, 9:54 AM

## 2021-04-26 NOTE — Progress Notes (Signed)
DISCHARGE NOTE:  Pt given discharge instructions. Pt verbalized understanding. Pt wheeled to car by staff.  

## 2021-08-25 ENCOUNTER — Other Ambulatory Visit: Payer: Self-pay

## 2021-09-16 ENCOUNTER — Ambulatory Visit
Admission: RE | Admit: 2021-09-16 | Discharge: 2021-09-16 | Disposition: A | Discharge status: Home / Self Care | Payer: Medicare Other | Source: Ambulatory Visit | Attending: Acute Care | Admitting: Acute Care

## 2021-09-16 DIAGNOSIS — F17210 Nicotine dependence, cigarettes, uncomplicated: Secondary | ICD-10-CM | POA: Insufficient documentation

## 2021-09-16 DIAGNOSIS — Z87891 Personal history of nicotine dependence: Secondary | ICD-10-CM | POA: Diagnosis present

## 2021-10-06 ENCOUNTER — Telehealth: Payer: Self-pay | Admitting: Acute Care

## 2021-10-06 DIAGNOSIS — F17210 Nicotine dependence, cigarettes, uncomplicated: Secondary | ICD-10-CM

## 2021-10-06 DIAGNOSIS — Z122 Encounter for screening for malignant neoplasm of respiratory organs: Secondary | ICD-10-CM

## 2021-10-06 DIAGNOSIS — Z87891 Personal history of nicotine dependence: Secondary | ICD-10-CM

## 2021-10-06 NOTE — Telephone Encounter (Signed)
I have attempted to call the patient with the results of their  Super D  CT Chest. There was no answer. There was no option to leave a VM on the call, as the VM has not been set up.  I attempted to call the patient's son, [name], and the number listed for him was answered but the gentleman stated he was not [name]. I will call [name] to see how they get in touch with this patient.  ?[name] has all the same contact information we have for the patient, in addition to the son's number. ?I have made them aware of the dilated common bile duct measuring up to 2.0 cm.and recommendation for an MRCP, and the questionable findings of airways centered infection or chronic aspiration. I asked that they follow up as they feel is appropriate. ?[name], they have asked that the scan results  be faxed to them. Additionally, we need to go ahead and mail a letter asking the patient to return our call as she does not have a VM, and is not answering her phone. Thanks so much  ?

## 2021-10-12 ENCOUNTER — Encounter: Payer: Self-pay | Admitting: *Deleted

## 2021-10-12 NOTE — Telephone Encounter (Signed)
CT results faxed to PCP. Letter mailed to pt asking her to contact us to discuss CT results.  ?

## 2021-10-13 NOTE — Telephone Encounter (Signed)
I called the patient with the results of her low dose Ct Chest. I exlained that her scan was read as a Lung  RADS 3, nodules that are probably benign findings, short term follow up suggested: includes nodules with a low likelihood of becoming a clinically active cancer. Radiology recommends a 6 month repeat LDCT follow up.  ?She is in agreement with a 6 month follow up scan.  ?I also explained that there was notation of suspected aspiration on the scan, and she told me that she does sometimes get choked on her food.  ?I had called [facility] on 10/06/2021 when we were unable to get in touch with the patient and told them there was concern for aspiration . [name], will you let them know the patient does endorse choking on her food when she eats, and have them follow up as they feel is appropriate ( Swallow eval). ?We will re-evaluate findings at her 6 month follow up scan.  ?Please order 6 month follow up low dose CT, and fax results to PCP. Thanks so much ?

## 2021-10-14 NOTE — Telephone Encounter (Signed)
Spoke with [facility]. They took a message to let PCP know that pt has been having difficulty with choking with food and will possibly need a swallowing evaluation. CT results have already been faxed to their office. Order placed for 6 mth nodule f/u CT scan.  ?

## 2021-11-21 ENCOUNTER — Other Ambulatory Visit: Payer: Self-pay | Admitting: Family Medicine

## 2021-11-21 DIAGNOSIS — Z1231 Encounter for screening mammogram for malignant neoplasm of breast: Secondary | ICD-10-CM

## 2021-12-14 ENCOUNTER — Ambulatory Visit
Admission: RE | Admit: 2021-12-14 | Discharge: 2021-12-14 | Disposition: A | Discharge status: Home / Self Care | Payer: Medicare Other | Source: Ambulatory Visit | Attending: Family Medicine | Admitting: Family Medicine

## 2021-12-14 DIAGNOSIS — Z1231 Encounter for screening mammogram for malignant neoplasm of breast: Secondary | ICD-10-CM | POA: Insufficient documentation

## 2022-04-04 ENCOUNTER — Ambulatory Visit: Admission: RE | Admit: 2022-04-04 | Payer: 59 | Source: Ambulatory Visit

## 2023-03-07 ENCOUNTER — Other Ambulatory Visit: Payer: Self-pay | Admitting: Family Medicine

## 2023-03-07 DIAGNOSIS — Z1231 Encounter for screening mammogram for malignant neoplasm of breast: Secondary | ICD-10-CM

## 2023-03-15 ENCOUNTER — Ambulatory Visit
Admission: RE | Admit: 2023-03-15 | Discharge: 2023-03-15 | Disposition: A | Discharge status: Home / Self Care | Payer: 59 | Source: Ambulatory Visit | Attending: Family Medicine | Admitting: Family Medicine

## 2023-03-15 DIAGNOSIS — Z1231 Encounter for screening mammogram for malignant neoplasm of breast: Secondary | ICD-10-CM | POA: Diagnosis present

## 2023-05-31 LAB — COLOGUARD: COLOGUARD: NEGATIVE

## 2023-12-27 ENCOUNTER — Other Ambulatory Visit: Payer: Self-pay | Admitting: Physician Assistant

## 2023-12-27 ENCOUNTER — Ambulatory Visit

## 2023-12-27 DIAGNOSIS — R2231 Localized swelling, mass and lump, right upper limb: Secondary | ICD-10-CM

## 2024-01-03 ENCOUNTER — Ambulatory Visit

## 2024-01-22 ENCOUNTER — Ambulatory Visit
Admission: RE | Admit: 2024-01-22 | Discharge: 2024-01-22 | Disposition: A | Discharge status: Home / Self Care | Source: Ambulatory Visit | Attending: Physician Assistant | Admitting: Physician Assistant

## 2024-01-22 DIAGNOSIS — R2231 Localized swelling, mass and lump, right upper limb: Secondary | ICD-10-CM | POA: Diagnosis present

## 2024-02-04 ENCOUNTER — Other Ambulatory Visit: Payer: Self-pay

## 2024-02-04 ENCOUNTER — Emergency Department

## 2024-02-04 ENCOUNTER — Inpatient Hospital Stay (HOSPITAL_COMMUNITY)
Admit: 2024-02-04 | Discharge: 2024-02-04 | Disposition: A | Discharge status: Home / Self Care | Attending: Family Medicine

## 2024-02-04 ENCOUNTER — Inpatient Hospital Stay
Admission: EM | Admit: 2024-02-04 | Discharge: 2024-02-05 | DRG: 189 | Disposition: A | Discharge status: Home / Self Care | Attending: Internal Medicine | Admitting: Internal Medicine

## 2024-02-04 DIAGNOSIS — Z1152 Encounter for screening for COVID-19: Secondary | ICD-10-CM

## 2024-02-04 DIAGNOSIS — Z720 Tobacco use: Secondary | ICD-10-CM | POA: Diagnosis not present

## 2024-02-04 DIAGNOSIS — I10 Essential (primary) hypertension: Secondary | ICD-10-CM | POA: Diagnosis not present

## 2024-02-04 DIAGNOSIS — R0602 Shortness of breath: Secondary | ICD-10-CM | POA: Diagnosis present

## 2024-02-04 DIAGNOSIS — J439 Emphysema, unspecified: Secondary | ICD-10-CM | POA: Diagnosis present

## 2024-02-04 DIAGNOSIS — I509 Heart failure, unspecified: Secondary | ICD-10-CM | POA: Diagnosis not present

## 2024-02-04 DIAGNOSIS — I5031 Acute diastolic (congestive) heart failure: Secondary | ICD-10-CM

## 2024-02-04 DIAGNOSIS — I2510 Atherosclerotic heart disease of native coronary artery without angina pectoris: Secondary | ICD-10-CM | POA: Diagnosis present

## 2024-02-04 DIAGNOSIS — F1910 Other psychoactive substance abuse, uncomplicated: Secondary | ICD-10-CM | POA: Diagnosis not present

## 2024-02-04 DIAGNOSIS — I421 Obstructive hypertrophic cardiomyopathy: Secondary | ICD-10-CM | POA: Diagnosis present

## 2024-02-04 DIAGNOSIS — I110 Hypertensive heart disease with heart failure: Secondary | ICD-10-CM | POA: Diagnosis present

## 2024-02-04 DIAGNOSIS — J441 Chronic obstructive pulmonary disease with (acute) exacerbation: Principal | ICD-10-CM | POA: Diagnosis present

## 2024-02-04 DIAGNOSIS — F1110 Opioid abuse, uncomplicated: Secondary | ICD-10-CM | POA: Diagnosis present

## 2024-02-04 DIAGNOSIS — Z882 Allergy status to sulfonamides status: Secondary | ICD-10-CM | POA: Diagnosis not present

## 2024-02-04 DIAGNOSIS — Z7982 Long term (current) use of aspirin: Secondary | ICD-10-CM | POA: Diagnosis not present

## 2024-02-04 DIAGNOSIS — J9601 Acute respiratory failure with hypoxia: Secondary | ICD-10-CM | POA: Diagnosis present

## 2024-02-04 DIAGNOSIS — F1410 Cocaine abuse, uncomplicated: Secondary | ICD-10-CM | POA: Diagnosis present

## 2024-02-04 DIAGNOSIS — R7303 Prediabetes: Secondary | ICD-10-CM | POA: Diagnosis present

## 2024-02-04 DIAGNOSIS — Z7951 Long term (current) use of inhaled steroids: Secondary | ICD-10-CM | POA: Diagnosis not present

## 2024-02-04 DIAGNOSIS — Z79899 Other long term (current) drug therapy: Secondary | ICD-10-CM | POA: Diagnosis not present

## 2024-02-04 DIAGNOSIS — R3915 Urgency of urination: Secondary | ICD-10-CM | POA: Diagnosis present

## 2024-02-04 DIAGNOSIS — I5021 Acute systolic (congestive) heart failure: Secondary | ICD-10-CM | POA: Diagnosis not present

## 2024-02-04 DIAGNOSIS — R011 Cardiac murmur, unspecified: Secondary | ICD-10-CM | POA: Diagnosis present

## 2024-02-04 DIAGNOSIS — J9600 Acute respiratory failure, unspecified whether with hypoxia or hypercapnia: Secondary | ICD-10-CM

## 2024-02-04 DIAGNOSIS — F17210 Nicotine dependence, cigarettes, uncomplicated: Secondary | ICD-10-CM | POA: Diagnosis present

## 2024-02-04 DIAGNOSIS — E785 Hyperlipidemia, unspecified: Secondary | ICD-10-CM | POA: Diagnosis present

## 2024-02-04 DIAGNOSIS — Z716 Tobacco abuse counseling: Secondary | ICD-10-CM | POA: Diagnosis not present

## 2024-02-04 DIAGNOSIS — I5033 Acute on chronic diastolic (congestive) heart failure: Secondary | ICD-10-CM | POA: Diagnosis present

## 2024-02-04 LAB — CBC
HCT: 42.2 % (ref 36.0–46.0)
Hemoglobin: 13.9 g/dL (ref 12.0–15.0)
MCH: 32.9 pg (ref 26.0–34.0)
MCHC: 32.9 g/dL (ref 30.0–36.0)
MCV: 100 fL (ref 80.0–100.0)
Platelets: 213 K/uL (ref 150–400)
RBC: 4.22 MIL/uL (ref 3.87–5.11)
RDW: 12.5 % (ref 11.5–15.5)
WBC: 8.6 K/uL (ref 4.0–10.5)
nRBC: 0 % (ref 0.0–0.2)

## 2024-02-04 LAB — ECHOCARDIOGRAM COMPLETE
AR max vel: 4.22 cm2
AV Area VTI: 2.42 cm2
AV Area mean vel: 2.62 cm2
AV Mean grad: 10 mmHg
AV Peak grad: 19.9 mmHg
Ao pk vel: 2.23 m/s
Area-P 1/2: 2.07 cm2
Calc EF: 59.8 %
MV VTI: 3.39 cm2
S' Lateral: 2.9 cm
Single Plane A2C EF: 59.3 %
Single Plane A4C EF: 59.9 %

## 2024-02-04 LAB — CBC WITH DIFFERENTIAL/PLATELET
Abs Immature Granulocytes: 0.01 K/uL (ref 0.00–0.07)
Basophils Absolute: 0 K/uL (ref 0.0–0.1)
Basophils Relative: 0 %
Eosinophils Absolute: 0.2 K/uL (ref 0.0–0.5)
Eosinophils Relative: 2 %
HCT: 42.1 % (ref 36.0–46.0)
Hemoglobin: 14.1 g/dL (ref 12.0–15.0)
Immature Granulocytes: 0 %
Lymphocytes Relative: 24 %
Lymphs Abs: 2 K/uL (ref 0.7–4.0)
MCH: 33.4 pg (ref 26.0–34.0)
MCHC: 33.5 g/dL (ref 30.0–36.0)
MCV: 99.8 fL (ref 80.0–100.0)
Monocytes Absolute: 0.8 K/uL (ref 0.1–1.0)
Monocytes Relative: 10 %
Neutro Abs: 5 K/uL (ref 1.7–7.7)
Neutrophils Relative %: 64 %
Platelets: 230 K/uL (ref 150–400)
RBC: 4.22 MIL/uL (ref 3.87–5.11)
RDW: 12.6 % (ref 11.5–15.5)
WBC: 8 K/uL (ref 4.0–10.5)
nRBC: 0 % (ref 0.0–0.2)

## 2024-02-04 LAB — BASIC METABOLIC PANEL WITH GFR
Anion gap: 6 (ref 5–15)
Anion gap: 10 (ref 5–15)
BUN: 17 mg/dL (ref 8–23)
BUN: 19 mg/dL (ref 8–23)
CO2: 26 mmol/L (ref 22–32)
CO2: 25 mmol/L (ref 22–32)
Calcium: 8.6 mg/dL — ABNORMAL LOW (ref 8.9–10.3)
Calcium: 8.9 mg/dL (ref 8.9–10.3)
Chloride: 108 mmol/L (ref 98–111)
Chloride: 106 mmol/L (ref 98–111)
Creatinine, Ser: 0.76 mg/dL (ref 0.44–1.00)
Creatinine, Ser: 0.69 mg/dL (ref 0.44–1.00)
GFR, Estimated: >60 mL/min (ref >60)
GFR, Estimated: >60 mL/min (ref >60)
Glucose, Bld: 114 mg/dL — ABNORMAL HIGH (ref 70–99)
Glucose, Bld: 148 mg/dL — ABNORMAL HIGH (ref 70–99)
Potassium: 4 mmol/L (ref 3.5–5.1)
Potassium: 4.3 mmol/L (ref 3.5–5.1)
Sodium: 140 mmol/L (ref 135–145)
Sodium: 141 mmol/L (ref 135–145)

## 2024-02-04 LAB — BRAIN NATRIURETIC PEPTIDE: B Natriuretic Peptide: 297.9 pg/mL — ABNORMAL HIGH (ref 0.0–100.0)

## 2024-02-04 LAB — RESP PANEL BY RT-PCR (RSV, FLU A&B, COVID)  RVPGX2
Influenza A by PCR: NEGATIVE
Influenza B by PCR: NEGATIVE
Resp Syncytial Virus by PCR: NEGATIVE
SARS Coronavirus 2 by RT PCR: NEGATIVE

## 2024-02-04 LAB — RESPIRATORY PANEL BY PCR

## 2024-02-04 LAB — URINALYSIS, COMPLETE (UACMP) WITH MICROSCOPIC
Bacteria, UA: NONE SEEN
Bilirubin Urine: NEGATIVE
Glucose, UA: NEGATIVE mg/dL
Hgb urine dipstick: NEGATIVE
Ketones, ur: NEGATIVE mg/dL
Leukocytes,Ua: NEGATIVE
Nitrite: NEGATIVE
Protein, ur: NEGATIVE mg/dL
Specific Gravity, Urine: 1.009 (ref 1.005–1.030)
Squamous Epithelial / HPF: 0 /HPF (ref 0–5)
pH: 6 (ref 5.0–8.0)

## 2024-02-04 LAB — URINE DRUG SCREEN, QUALITATIVE (ARMC ONLY)
Amphetamines, Ur Screen: NOT DETECTED
Barbiturates, Ur Screen: NOT DETECTED
Benzodiazepine, Ur Scrn: NOT DETECTED
Cannabinoid 50 Ng, Ur ~~LOC~~: NOT DETECTED
Cocaine Metabolite,Ur ~~LOC~~: POSITIVE — AB
MDMA (Ecstasy)Ur Screen: NOT DETECTED
Methadone Scn, Ur: NOT DETECTED
Opiate, Ur Screen: POSITIVE — AB
Phencyclidine (PCP) Ur S: NOT DETECTED
Tricyclic, Ur Screen: NOT DETECTED

## 2024-02-04 LAB — HIV ANTIBODY (ROUTINE TESTING W REFLEX): HIV Screen 4th Generation wRfx: NONREACTIVE

## 2024-02-04 LAB — BLOOD GAS, VENOUS
Acid-Base Excess: 2.8 mmol/L — ABNORMAL HIGH (ref 0.0–2.0)
Bicarbonate: 30.4 mmol/L — ABNORMAL HIGH (ref 20.0–28.0)
Delivery systems: POSITIVE
FIO2: 100 %
O2 Saturation: 99.8 %
Patient temperature: 37
pCO2, Ven: 59 mmHg (ref 44–60)
pH, Ven: 7.32 (ref 7.25–7.43)
pO2, Ven: 220 mmHg — ABNORMAL HIGH (ref 32–45)

## 2024-02-04 LAB — TROPONIN I (HIGH SENSITIVITY)
Troponin I (High Sensitivity): 13 ng/L (ref <18)
Troponin I (High Sensitivity): 15 ng/L (ref <18)

## 2024-02-04 MED ORDER — FUROSEMIDE 10 MG/ML IJ SOLN
40.0000 mg | Freq: Two times a day (BID) | INTRAMUSCULAR | Status: DC
  Administered 2024-02-04: 40 mg via INTRAVENOUS
  Filled 2024-02-04: qty 4

## 2024-02-04 MED ORDER — ACETAMINOPHEN 650 MG RE SUPP: 650.0000 mg | Freq: Four times a day (QID) | RECTAL | Status: DC | PRN

## 2024-02-04 MED ORDER — FUROSEMIDE 10 MG/ML IJ SOLN
40.0000 mg | Freq: Once | INTRAMUSCULAR | Status: AC
  Administered 2024-02-04: 40 mg via INTRAVENOUS
  Filled 2024-02-04: qty 4

## 2024-02-04 MED ORDER — IPRATROPIUM-ALBUTEROL 0.5-2.5 (3) MG/3ML IN SOLN
3.0000 mL | RESPIRATORY_TRACT | Status: AC
  Administered 2024-02-04 (×2): 3 mL via RESPIRATORY_TRACT
  Filled 2024-02-04 (×2): qty 3

## 2024-02-04 MED ORDER — ONDANSETRON HCL 4 MG/2ML IJ SOLN: 4.0000 mg | Freq: Four times a day (QID) | INTRAMUSCULAR | Status: DC | PRN

## 2024-02-04 MED ORDER — ASPIRIN 81 MG PO TBEC
81.0000 mg | DELAYED_RELEASE_TABLET | Freq: Every day | ORAL | Status: DC
  Administered 2024-02-04 – 2024-02-05 (×2): 81 mg via ORAL
  Filled 2024-02-04 (×2): qty 1

## 2024-02-04 MED ORDER — ONDANSETRON HCL 4 MG PO TABS: 4.0000 mg | ORAL_TABLET | Freq: Four times a day (QID) | ORAL | Status: DC | PRN

## 2024-02-04 MED ORDER — TRAZODONE HCL 50 MG PO TABS: 25.0000 mg | ORAL_TABLET | Freq: Every evening | ORAL | Status: DC | PRN

## 2024-02-04 MED ORDER — MAGNESIUM HYDROXIDE 400 MG/5ML PO SUSP: 30.0000 mL | Freq: Every day | ORAL | Status: DC | PRN

## 2024-02-04 MED ORDER — ENOXAPARIN SODIUM 40 MG/0.4ML IJ SOSY
40.0000 mg | PREFILLED_SYRINGE | INTRAMUSCULAR | Status: DC
  Administered 2024-02-04: 40 mg via SUBCUTANEOUS
  Filled 2024-02-04: qty 0.4

## 2024-02-04 MED ORDER — NICOTINE 14 MG/24HR TD PT24
14.0000 mg | MEDICATED_PATCH | Freq: Every day | TRANSDERMAL | Status: DC
  Administered 2024-02-04 – 2024-02-05 (×2): 14 mg via TRANSDERMAL
  Filled 2024-02-04 (×2): qty 1

## 2024-02-04 MED ORDER — PREDNISONE 50 MG PO TABS
50.0000 mg | ORAL_TABLET | Freq: Every day | ORAL | Status: DC
  Administered 2024-02-05: 50 mg via ORAL
  Filled 2024-02-04: qty 1

## 2024-02-04 MED ORDER — MAGNESIUM SULFATE 2 GM/50ML IV SOLN
2.0000 g | Freq: Once | INTRAVENOUS | Status: AC
  Administered 2024-02-04: 2 g via INTRAVENOUS
  Filled 2024-02-04: qty 50

## 2024-02-04 MED ORDER — GUAIFENESIN ER 600 MG PO TB12
600.0000 mg | ORAL_TABLET | Freq: Two times a day (BID) | ORAL | Status: DC
  Administered 2024-02-04 – 2024-02-05 (×3): 600 mg via ORAL
  Filled 2024-02-04 (×3): qty 1

## 2024-02-04 MED ORDER — ACETAMINOPHEN 325 MG PO TABS: 650.0000 mg | ORAL_TABLET | Freq: Four times a day (QID) | ORAL | Status: DC | PRN

## 2024-02-04 MED ORDER — ISOSORBIDE MONONITRATE ER 60 MG PO TB24: 30.0000 mg | ORAL_TABLET | Freq: Every day | ORAL | Status: DC

## 2024-02-04 MED ORDER — METHYLPREDNISOLONE SODIUM SUCC 40 MG IJ SOLR
40.0000 mg | Freq: Two times a day (BID) | INTRAMUSCULAR | Status: DC
  Administered 2024-02-04: 40 mg via INTRAVENOUS
  Filled 2024-02-04: qty 1

## 2024-02-04 MED ORDER — IPRATROPIUM-ALBUTEROL 0.5-2.5 (3) MG/3ML IN SOLN
3.0000 mL | Freq: Four times a day (QID) | RESPIRATORY_TRACT | Status: DC
  Administered 2024-02-04 – 2024-02-05 (×4): 3 mL via RESPIRATORY_TRACT
  Filled 2024-02-04 (×4): qty 3

## 2024-02-04 MED ORDER — LOSARTAN POTASSIUM 50 MG PO TABS
100.0000 mg | ORAL_TABLET | Freq: Every day | ORAL | Status: DC
  Administered 2024-02-04 – 2024-02-05 (×2): 100 mg via ORAL
  Filled 2024-02-04 (×2): qty 2

## 2024-02-04 MED ORDER — SODIUM CHLORIDE 0.9 % IV SOLN
1.0000 g | INTRAVENOUS | Status: DC
  Administered 2024-02-04: 1 g via INTRAVENOUS
  Filled 2024-02-04: qty 10

## 2024-02-04 MED ORDER — TIOTROPIUM BROMIDE MONOHYDRATE 18 MCG IN CAPS: 18.0000 ug | ORAL_CAPSULE | Freq: Every day | RESPIRATORY_TRACT | Status: DC

## 2024-02-04 MED ORDER — HYDROCOD POLI-CHLORPHE POLI ER 10-8 MG/5ML PO SUER: 5.0000 mL | Freq: Two times a day (BID) | ORAL | Status: DC | PRN

## 2024-02-04 MED ORDER — METOPROLOL TARTRATE 25 MG PO TABS: 25.0000 mg | ORAL_TABLET | Freq: Two times a day (BID) | ORAL | Status: DC

## 2024-02-04 MED ORDER — METOPROLOL SUCCINATE ER 50 MG PO TB24
50.0000 mg | ORAL_TABLET | Freq: Every day | ORAL | Status: DC
  Administered 2024-02-04 – 2024-02-05 (×2): 50 mg via ORAL
  Filled 2024-02-04 (×2): qty 1

## 2024-02-04 MED ORDER — ROSUVASTATIN CALCIUM 10 MG PO TABS
10.0000 mg | ORAL_TABLET | Freq: Every evening | ORAL | Status: DC
  Administered 2024-02-04: 10 mg via ORAL
  Filled 2024-02-04 (×2): qty 1

## 2024-02-04 NOTE — ED Notes (Signed)
 Pt readjusted in bed and new chux pad placed underneath. Pt given warm blankets.

## 2024-02-04 NOTE — ED Triage Notes (Signed)
 Chief Complaint  Patient presents with   Respiratory Distress   Pt arrives to ED from home. Reports that s/s began x45 mins ago. Initial O2 sat was 78%. Pt does not wear O2. Pt was given solu-medrol  and duo-neb en route to facilty. Pt arrives on CPAP.   Past Medical History:  Diagnosis Date   COPD (chronic obstructive pulmonary disease) (HCC)    Hypertension

## 2024-02-04 NOTE — ED Notes (Signed)
 Respiratory at patients bed at this time. Pt currently placed on 4L [place].

## 2024-02-04 NOTE — Assessment & Plan Note (Signed)
 Will continue statin therapy

## 2024-02-04 NOTE — Assessment & Plan Note (Addendum)
 Significant wheezing on presentation, seems improving. Discontinuing ceftriaxone  as there is no concern of pneumonia. Respiratory panel was negative for COVID, RSV and influenza -Check respiratory viral panel - Continue with steroid-switching Solu-Medrol  with prednisone  from tomorrow. - Continue with bronchodilators

## 2024-02-04 NOTE — Progress Notes (Signed)
 Progress Note   Patient: Kristi Adams FMW:[id number] DOB: 03-05-1956 DOA: 02/04/2024     0 DOS: the patient was seen and examined on 02/04/2024   Brief hospital course: Partly taken from H&P.  Kristi Adams is a 67 y.o. female with medical history significant for COPD and essential hypertension, who presented to the emergency room, who presented to the emergency room with acute onset of worsening dyspnea with associated cough with inability to expectorate as well as wheezing over the last couple weeks.  She has been having intermittent chills but denied any measured fever.  She also admits orthopnea and PND along with dyspnea on exertion and worsening lower extremity edema.  On presentation patient with mildly elevated blood pressure and tachypneic, she was on CPAP which was later transitioned to BiPAP. Labs with BNP of 297, troponin 13>> 15, rest of the labs mostly unremarkable, respiratory panel negative.  UA negative for UTI, UDS positive for cocaine and opiates. CXR with increased interstitial markings in the bases that could be due to interstitial pneumonitis or edema, small pleural effusions, aortic atherosclerosis and emphysema.  Patient was giving breathing treatment and IV Lasix  in ED.  8/25: Patient was transitioned to 2 L of oxygen, discontinuing ceftriaxone  as there is no evidence of infection.  No orthopnea today.  Switching Solu-Medrol  with prednisone  from tomorrow. Echocardiogram with hyperdynamic function at 70 to 75%, no regional wall motion abnormalities, grade 1 diastolic dysfunction and moderate LVH, more pronounced in the septum, with moderate LV outlet obstruction consistent with with her history of HOCM. Further diuresis was discontinued by cardiology to avoid volume depletion with underlying HCOM.  Assessment and Plan: * Acute respiratory failure with hypoxia (HCC) This is likely secondary to acute exacerbation of COPD, might have some element of acute on chronic  HFpEF.  No baseline oxygen use and required BiPAP transiently, currently on 2 L of oxygen with no baseline oxygen use. - Continue supplemental oxygen-wean as tolerated  COPD with acute exacerbation (HCC) Significant wheezing on presentation, seems improving. Discontinuing ceftriaxone  as there is no concern of pneumonia. Respiratory panel was negative for COVID, RSV and influenza -Check respiratory viral panel - Continue with steroid-switching Solu-Medrol  with prednisone  from tomorrow. - Continue with bronchodilators   Acute on chronic heart failure with preserved ejection fraction (HFpEF) (HCC) History of HCOM. Concern of acute on chronic HFpEF, echocardiogram with hyperdynamic EF and grade 1 diastolic dysfunction.  Moderate LV outlet obstruction which is consistent with her history of HCOM. BNP of 297.  Clinically appears euvolemic and received a dose of IV Lasix  -Further IV Lasix  was discontinued by cardiology to prevent volume depletion with underlying HCOM - Will continue Cozaar  and Toprol -XL. -Patient should avoid cocaine with current underlying cardiac condition.  Substance abuse (HCC) UDS positive for opiates and cocaine. - Patient was counseled  Dyslipidemia - Will continue statin therapy.  Essential hypertension - Continue Cozaar  and metoprolol   Tobacco abuse She will have counseling for smoking cessation. - Nicotine  patch as needed   Subjective: Patient was feeling tired and somnolent when seen today.  Easily arousable but wants to get some sleep.  Per patient there is no baseline oxygen use.  No orthopnea and he was able to lay flat.  Physical Exam: Vitals:   02/04/24 1030 02/04/24 1123 02/04/24 1200 02/04/24 1215  BP: 110/69  (!) 106/56   Pulse: 68   64  Resp: 17   18  Temp:  98.3 F (36.8 C)    TempSrc:  Oral    SpO2: 98%   94%   General.  Ill-appearing lady, in no acute distress. Pulmonary.  Some scattered wheeze bilaterally, normal respiratory  effort. CV.  Regular rate and rhythm, no JVD, rub or murmur. Abdomen.  Soft, nontender, nondistended, BS positive. CNS.  Somnolent but arousable.  No focal neurologic deficit. Extremities.  No edema, no cyanosis, pulses intact and symmetrical. Psychiatry.  Judgment and insight appears normal.   Data Reviewed: Prior data reviewed  Family Communication:   Disposition: Status is: Inpatient Remains inpatient appropriate because: Severity of illness  Planned Discharge Destination: Nasal packingHome  DVT prophylaxis.  Lovenox  Time spent:   This record has been created using Conservation officer, historic buildings. Errors have been sought and corrected,but may not always be located. Such creation errors do not reflect on the standard of care.   Author: Amaryllis Dare, MD 02/04/2024 1:10 PM  For on call review [email or web address].

## 2024-02-04 NOTE — Consult Note (Signed)
 Cardiology Consultation   Patient ID: Kristi Adams MRN: [id number]; DOB: May 23, 1956  Admit date: 02/04/2024 Date of Consult: 02/04/2024  PCP:  [facility]   [facility] Providers Cardiologist:  None        Patient Profile: Kristi Adams is a 68 y.o. female with a hx of mild nonobstructive CAD, HOCM, hypertension, hyperlipidemia, COPD, prediabetes, tobacco use, and history of cocaine use who is being seen 02/04/2024 for the evaluation of dyspnea at the request of Dr. Lawence.  History of Present Illness:   Kristi Adams follows with [facility] cardiology for her nonobstructive coronary artery disease, hypertension, hypertrophic obstructive cardiomyopathy with class NYHA II-III symptoms, and hyperlipidemia.  She underwent cardiac catheterization in the setting of abnormal stress testing 04/2013 which revealed mild nonobstructive CAD.  She underwent TEE 11/2020 to assess mitral valve and LVOT revealing normal LV systolic function, turbulent flow in the LVOT suggestive of mild outflow obstruction, and mildly thickened mitral valve leaflets with normal mobility, and mild to moderate mitral valve regurgitation.  Repeat stress testing 08/2015 revealed normal perfusion with no evidence of ischemia.  She had cardiac MRI 10/2022 which showed normal LV size with severely increased wall thickness (1.8 cm septal thickness), hyperdynamic LV systolic function with EF greater than 70%, near complete midsystolic LVOT obstruction with systolic anterior motion of the mitral valve, and findings suggestive of mid endocardial and subendocardial enhancement consistent with scattered fibrosis.  Most recent echocardiogram 12/2023 revealed moderate concentric hypertrophy, LVEF greater than 70%, dynamic LVOT obstruction noted at rest and with Valsalva (consistent with 8 oh ZMM physiology), G1 DD, normal RV size and function, and no significant valvular abnormalities.  She was recently hospitalized at [facility] 7/22  - 7/24 in the setting of chest pain.  Troponin was minimally elevated and flat trending.  D-dimer was positive.  CTA negative for PE.  She was treated for UTI and COPD exacerbation and discharged in stable condition.  She was seen in cardiology follow-up 01/10/2024 with cardiac symptoms stable compared to her baseline.  Imdur  was discontinued and her dose of losartan  was reduced.  There was consideration for mavacamten or EtOH ablation if she was to minimize her cocaine use.    Patient reports worsening dyspnea and wheezing for the past several days with associated cough.  She denies chest pain, palpitations, lightheadedness, dizziness, and lower extremity swelling.  She was brought to the ED via EMS who found her in the tripoding position with initial O2 sat of 78%.  She was given Solu-Medrol  and DuoNeb en route and placed on CPAP. In the ED, RR 23 and saturating at 100% on CPAP with otherwise normal vital signs. CBC and BMP largely unremarkable. Troponin negative x 2. BNP mildly elevated at 297. Blood gas with pH 7.32 and HCO3 30.4. CXR shows increased interstitial markings in the bases consisted with interstitial pneumonitis vs edema, small pleural effusions, and emphysema. She was given DuoNeb, 2 g of IV mag sulfate, and IV Lasix  40 mg. Cardiology was asked to consult for possible heart failure exacerbation.  At time of cardiology exam, patient reports some improvement in dyspnea.  She remains on 4 L supplemental oxygen.  She continues to deny chest pain.  She reports ongoing cocaine use about 2 times per week.  She reports compliance with her medications.  Past Medical History:  Diagnosis Date   COPD (chronic obstructive pulmonary disease) (HCC)    Hypertension     History reviewed. No pertinent surgical history.  Scheduled Meds:  aspirin  EC  81 mg Oral Daily   enoxaparin  (LOVENOX ) injection  40 mg Subcutaneous Q24H   furosemide   40 mg Intravenous Q12H   guaiFENesin   600 mg Oral BID    ipratropium-albuterol   3 mL Nebulization QID   losartan   100 mg Oral Daily   methylPREDNISolone  (SOLU-MEDROL ) injection  40 mg Intravenous Q12H   metoprolol  succinate  50 mg Oral Daily   nicotine   14 mg Transdermal Daily   Continuous Infusions:  cefTRIAXone  (ROCEPHIN )  IV Stopped (02/04/24 0444)   PRN Meds: acetaminophen  **OR** acetaminophen , chlorpheniramine-HYDROcodone, magnesium  hydroxide, ondansetron  **OR** ondansetron  (ZOFRAN ) IV, traZODone   Allergies:    Allergies  Allergen Reactions   Sulfamethoxazole Itching    Social History:   Social History   Socioeconomic History   Marital status: Single    Spouse name: Not on file   Number of children: Not on file   Years of education: Not on file   Highest education level: Not on file  Occupational History   Not on file  Tobacco Use   Smoking status: Every Day    Current packs/day: 1.00    Average packs/day: 1 pack/day for 40.0 years (40.0 ttl pk-yrs)    Types: Cigarettes   Smokeless tobacco: Never  Substance and Sexual Activity   Alcohol use: No   Drug use: Not on file   Sexual activity: Not on file  Other Topics Concern   Not on file  Social History Narrative   Not on file   Social Drivers of Health   Financial Resource Strain: Low Risk  (01/22/2021)   Received from [facility]   Overall Financial Resource Strain (CARDIA)    Difficulty of Paying Living Expenses: Not very hard  Food Insecurity: No Food Insecurity (01/22/2021)   Received from [facility]   Hunger Vital Sign    Within the past 12 months, you worried that your food would run out before you got the money to buy more.: Never true    Within the past 12 months, the food you bought just didn't last and you didn't have money to get more.: Never true  Transportation Needs: No Transportation Needs (01/22/2021)   Received from [facility]   PRAPARE - Transportation    Lack of Transportation (Medical): No    Lack of Transportation (Non-Medical):  No  Physical Activity: Not on file  Stress: Not on file  Social Connections: Not on file  Intimate Partner Violence: Not on file    Family History:    Family History  Problem Relation Age of Onset   Breast cancer Maternal Aunt    Breast cancer Cousin      ROS:  Please see the history of present illness.   All other ROS reviewed and negative.     Physical Exam/Data: Vitals:   02/04/24 0630 02/04/24 0645 02/04/24 0700 02/04/24 0717  BP: (!) 96/57  (!) 88/57   Pulse: (!) 59 (!) 58 60 63  Resp: 15 15 18 17   Temp:      TempSrc:      SpO2: 100% 100% 98% 99%   No intake or output data in the 24 hours ending 02/04/24 0746    09/16/2021   11:00 AM 04/25/2021    3:13 PM 02/12/2020   11:00 AM  Last 3 Weights  Weight (lbs) 162 lb 162 lb 177 lb  Weight (kg) 73.483 kg 73.483 kg 80.287 kg     There is no height or  weight on file to calculate BMI.  General:  Well nourished, well developed, in no acute distress HEENT: normal Neck: no JVD Vascular: No carotid bruits; Distal pulses 2+ bilaterally Cardiac:  normal S1, S2; RRR; 3/6 systolic murmur  Lungs: Faint expiratory wheezing with diminished breath sounds Abd: soft, nontender, no hepatomegaly  Ext: no edema Skin: warm and dry  Psych:  Normal affect   EKG:  The EKG was personally reviewed and demonstrates: Sinus rhythm with nonspecific T wave abnormalities, LVH, rate 69 bpm Telemetry:  Telemetry was personally reviewed and demonstrates: Sinus rhythm  Relevant CV Studies:  01/02/2024 Echo complete [facility])   1. The left ventricle is normal in size with moderate concentric  hypertrophy.   2. The left ventricular systolic function is hyperdynamic, LVEF is visually  estimated at >70%.   3. Dynamic LVOT obstruction noted at rest and with Valsalva consistent with  HOCM physiology- see details below.    4. There is grade I diastolic dysfunction (impaired relaxation).    5. The right ventricle is normal in size, with normal systolic  function.    6. There are no significant valvular abnormalities.   11/01/2022 Cardiac MRI [facility]) - Normal left ventricle size with severely increased wall thickness (1.8cm  septal thickness)  - Left ventricular systolic function is hyperdynamic with a left  ventricular ejection fraction > 70%. There is near complete mid-systolic  LVOT obstruction with systolic anterior motion of the mitral valve.  - Technically limited late gadolinium imaging but there is suggestion of  mid-endocardial and sub-endocardial enhancement consistent with scattered  fibrosis   Laboratory Data: High Sensitivity Troponin:   Recent Labs  Lab 02/04/24 0027 02/04/24 0317  TROPONINIHS 13 15     Chemistry Recent Labs  Lab 02/04/24 0027 02/04/24 0612  NA 140 141  K 4.0 4.3  CL 108 106  CO2 26 25  GLUCOSE 114* 148*  BUN 17 19  CREATININE 0.76 0.69  CALCIUM  8.6* 8.9  GFRNONAA >60 >60  ANIONGAP 6 10    No results for input(s): PROT, ALBUMIN, AST, ALT, ALKPHOS, BILITOT in the last 168 hours. Lipids No results for input(s): CHOL, TRIG, HDL, LABVLDL, LDLCALC, CHOLHDL in the last 168 hours.  Hematology Recent Labs  Lab 02/04/24 0027 02/04/24 0612  WBC 8.0 8.6  RBC 4.22 4.22  HGB 14.1 13.9  HCT 42.1 42.2  MCV 99.8 100.0  MCH 33.4 32.9  MCHC 33.5 32.9  RDW 12.6 12.5  PLT 230 213   Thyroid No results for input(s): TSH, FREET4 in the last 168 hours.  BNP Recent Labs  Lab 02/04/24 0027  BNP 297.9*    DDimer No results for input(s): DDIMER in the last 168 hours.  Radiology/Studies:  DG Chest Portable 1 View Result Date: 02/04/2024 IMPRESSION: 1. Increased interstitial markings in the bases, could be due to interstitial pneumonitis or edema. 2. Small pleural effusions. 3. Aortic atherosclerosis. 4. Emphysema. Electronically Signed   By: Francis Quam M.D.   On: 02/04/2024 01:19   US  RT UPPER EXTREM LTD SOFT TISSUE NON VASCULAR Result Date: 02/01/2024 IMPRESSION:  *There is a 2.1 x 3.1 x 3.4 cm cystic structure in the area of concern, anteriorly over the right shoulder. There is a 7 x 8 mm mural nodule along the posterior aspect of the structure. The structure is incompletely characterized on the current exam. Electronically Signed   By: Ree Molt M.D.   On: 02/01/2024 10:40   Assessment and Plan:  Acute on chronic  diastolic heart failure HOCM - Follow with [facility] cardiology with most recent echo 12/2023 demonstrating EF > 70% and dynamic LVOT obstruction (gradient ~75 mmHg), G1DD, and no significant valvular abnormalities - Presenting with significant respiratory distress - Unfortunately patient continues to use cocaine several times per week which is detrimental to her condition. She is not a candidate for advanced therapies due to this. Cessation is recommended.  - Continue IV Lasix  40 mg twice daily for now - Continue metoprolol   Polysubstance abuse - Ongoing tobacco and cocaine use - Recommend cessation as above  COPD exacerbation - Ongoing management with steroids and breathing treatments per IM  Hypertension - BP borderline low - She is continued on losartan  and metoprolol   Hyperlipidemia - LDL 76, continue statin therapy   For questions or updates, please contact [facility] Please consult [email or web address] for contact info under    Signed, Lesley LITTIE Maffucci, PA-C  02/04/2024 7:46 AM

## 2024-02-04 NOTE — Progress Notes (Signed)
 Education Assessment and Provision:  Detailed education and instructions provided on heart failure disease management including the following:  Signs and symptoms of Heart Failure When to call the physician Importance of daily weights Low sodium diet Fluid restriction Medication management Smoking Cessation Drug Use Anticipated future follow-up appointments  Patient education given on each of the above topics.  Patient acknowledges understanding via teach back method and acceptance of all instructions.  Education Materials:  Living Better With Heart Failure Booklet, HF zone tool, & Daily Weight Tracker Tool.  Patient has scale at home: Patient weighs herself when she is at work to use the scale there since she does not have one at home.   Patient has pill box at home: Yes.    Patient is already established at [facility]iology and will continue to follow-up there.  Hospital follow-up appointment scheduled on Sept. 18 @ 9:45 with Mliss Kerns, ANP @ [facility]. Will enter appointment details on Discharge AVS for patient.  Navigator will sign off at this time.  Charmaine Pines, RN, BSN [facility] Heart Failure Navigator Secure Chat Only

## 2024-02-04 NOTE — Assessment & Plan Note (Addendum)
Continue Cozaar and metoprolol.

## 2024-02-04 NOTE — Hospital Course (Addendum)
 Partly taken from H&P.  Kristi Adams is a 68 y.o. female with medical history significant for COPD and essential hypertension, who presented to the emergency room, who presented to the emergency room with acute onset of worsening dyspnea with associated cough with inability to expectorate as well as wheezing over the last couple weeks.  She has been having intermittent chills but denied any measured fever.  She also admits orthopnea and PND along with dyspnea on exertion and worsening lower extremity edema.  On presentation patient with mildly elevated blood pressure and tachypneic, she was on CPAP which was later transitioned to BiPAP. Labs with BNP of 297, troponin 13>> 15, rest of the labs mostly unremarkable, respiratory panel negative.  UA negative for UTI, UDS positive for cocaine and opiates. CXR with increased interstitial markings in the bases that could be due to interstitial pneumonitis or edema, small pleural effusions, aortic atherosclerosis and emphysema.  Patient was giving breathing treatment and IV Lasix  in ED.  8/25: Patient was transitioned to 2 L of oxygen, discontinuing ceftriaxone  as there is no evidence of infection.  No orthopnea today.  Switching Solu-Medrol  with prednisone  from tomorrow. Echocardiogram with hyperdynamic function at 70 to 75%, no regional wall motion abnormalities, grade 1 diastolic dysfunction and moderate LVH, more pronounced in the septum, with moderate LV outlet obstruction consistent with with her history of HOCM. Further diuresis was discontinued by cardiology to avoid volume depletion with underlying HCOM.  8/26: Hemodynamically stable, able to wean back to room air.  Patient was ambulated without any significant desaturation.  Scant expiratory wheeze on exam.  Discussed with cardiology and they are recommending starting low-dose Lasix  at 20 mg daily, both carvedilol  and metoprolol  was listed in her chart.  Metoprolol  was discontinued and she will  continue with carvedilol .  UDS was positive for cocaine.  She was counseled again as it can be lethal with her history of HOCM.  Patient was given 4 more days of prednisone  and she will continue her home bronchodilators.  Patient was instructed to follow-up with her providers including her cardiologist as outpatient for further assistance.

## 2024-02-04 NOTE — ED Notes (Signed)
 Assumed care of pt. Report received from previous RN. Pt alert and oriented. Pt on CCM and pulse ox at this time. Pt RR even and unlabored. Pt denies any needs at this time.

## 2024-02-04 NOTE — Assessment & Plan Note (Addendum)
 She will have counseling for smoking cessation. - Nicotine  patch as needed

## 2024-02-04 NOTE — Assessment & Plan Note (Addendum)
 This is likely secondary to acute exacerbation of COPD, might have some element of acute on chronic HFpEF.  No baseline oxygen use and required BiPAP transiently, currently on 2 L of oxygen with no baseline oxygen use. - Continue supplemental oxygen-wean as tolerated

## 2024-02-04 NOTE — Discharge Instructions (Signed)
 Intensive Outpatient Programs   [facility] [address] [place],  [place], [place] [phone]  [facility] (Inpatient and outpatient)[phone] (Suboxone and Methadone) [address] [phone]  ADS: [facility] Programs - Intensive Outpatient [address] [place], [place] [address], [place]  [phone]  [facility] (Outpatient, Inpatient, [facility] (Groups and Residental) (insurance only) [phone] [place], [place] [phone]   [facility] (for caregivers and family) [address] [place] [address] [place], [place] [phone]  Residential Treatment Programs  [facility](2 years) Residential: 26 days)[facility] ([facility] 700 [facility] [address] [place], [place], [place] [phone] or [phone]  [facility] [address] [place], [place], [place] [phone]  [facility] (RTS) [address] [place], [place], [place] [phone] Admissions: 8am-3pm M-F  [facility]ATS Program: Residential Program [phone] Days)             ADATC: [facility]  [place], [place], [place]  [phone] or [phone] in Hours over the weekend or by referral)  [facility] [address] [place], [place] [address] [phone] (Do virtual or phone assessment, offer transportation within 25 miles, have in patient and Outpatient options)   Mobil Crisis: [facility]:[phone] (for crisis  response 24 hours a day)           Intensive Outpatient Programs   [facility]  [facility] [address]  [address] [place],  [place]  [place], [place] [phone]  [facility]  [facility] (Inpatient and outpatient)    [phone] (Suboxone and Methadone) [address] [phone]  [facility]: [facility] - Intensive Outpatient [address]  [address] [place], [place] [address] [place], [place]  [phone]  [facility] (Outpatient, Inpatient, [facility] (Groups and Residental) (insurance only) [phone] [place], [place]   [phone]   [facility] (for caregivers and family) [address]  [address] [place], [place]  [place], [place] [phone]  Residential Treatment Programs  [facility](2 years) Residential: 90 days)  [facility] ([facility] 700 [facility]  [address] [place], [place]  [place], [place] [phone] or [phone]  [facility] [address] [place], [place]  [place], [place] [phone]  [facility] (RTS) [address] [place], [place] [address] [place], [place] [phone] Admissions: 8am-3pm M-F  BATS Program: Residential Program [phone] Days)           ADATC: [facility]  [place], [place]  [place], [place]  [phone] or [phone] (Walk in Hours over the weekend or by referral)  [facility] [address] [place], [place] [address] [phone] (Do virtual or phone assessment, offer transportation within 25 miles, have in patient and  Outpatient options)   Mobil Crisis: [facility]:[phone] (for crisis response 24 hours a day)     Agency Name: [facility] Agency Address: [address] [place], [place] [address]  Phone: [phone] Website: [email or web address] Service(s) Offered: Housing services, self-sufficiency, congregate meal program, and individual development account program.  Agency Name: [employer] Address: [address], [place], [place] [address] Phone: [phone] Email: info@[employer] .org Website: [email or web address].[employer].org Service(s) Offered: Housing the homeless, feeding the hungry, Company secretary, job and education related services.  Agency Name: [facility] Address: [address], [place], [place] [address] Phone: [phone] Email: [email or web address] Service(s) Offered: Counseling, problem pregnancy, advocacy for Hispanics, limited emergency financial assistance.  Agency Name: [employer] Address: [address] [place], [place] [address] Phone: [phone] Website: [email or web address] Service(s) Offered: Child support services; child welfare services; SNAP; Medicaid; work first family assistance; and aid with fuel,  rent, food and medicine.  Agency Name: Holiday representative Address: [address] [place], [place] [address] Phone: [phone] or [phone] Email: [email or web address] Service(s) Offered: Family services and transient assistance; emergency food, fuel, clothing, limited furniture, utilities; budget counseling, general counseling; give a kid a coat; thrift store; Christmas food and toys. Utility assistance, food pantry, rental  assistance, life sustaining medicine

## 2024-02-04 NOTE — H&P (Signed)
 [place]   PATIENT NAME: Kristi Adams    MR#:  [id number]  DATE OF BIRTH:  [birth year]  DATE OF ADMISSION:  02/04/2024  PRIMARY CARE PHYSICIAN: [facility]   Patient is coming from: Home  REQUESTING/REFERRING PHYSICIAN: Ward, Josette SAILOR, DO  CHIEF COMPLAINT:   Chief Complaint  Patient presents with   Respiratory Distress    HISTORY OF PRESENT ILLNESS:  Kristi Adams is a 68 y.o. female with medical history significant for COPD and essential hypertension, who presented to the emergency room, who presented to the emergency room with acute onset of worsening dyspnea with associated cough with inability to expectorate as well as wheezing over the last couple weeks.  She has been having intermittent chills but denied any measured fever.  No chest pain or palpitations.  No nausea or vomiting or abdominal pain.  She denies dysuria, oliguria or hematuria or flank pain.  She has been having mild urinary urgency though.  She admits to orthopnea and paroxysmal nocturnal dyspnea as well as dyspnea on exertion with worsening lower extremity edema.  ED Course: When the patient came to the ER BP was 159/96 with respiratory to 30 and pulse oximetry 99% on CPAP and later on BiPAP.  Labs revealed calcium  of 8.6 with otherwise unremarkable BMP.  BNP was 297.9 and high-sensitivity troponin I was 13 and later 15.  CBC was normal.  Respiratory panel came back negative.  VBG showed pH 7.32 and HCO3 30.4 EKG: Normal sinus rhythm rate of 87 with RSR' and T wave inversion in lateral leads, as reviewed by me. Imaging: Portable chest x-ray showed increased interstitial markings in the bases that could be related to interstitial pneumonitis or edema, small pleural effusions and aortic atherosclerosis as well as emphysema.  The patient was given DuoNeb, 2 g of IV magnesium  sulfate and 40 mg of IV Lasix .  She will be admitted to a progressive unit bed for further evaluation and  management. PAST MEDICAL HISTORY:   Past Medical History:  Diagnosis Date   COPD (chronic obstructive pulmonary disease) (HCC)    Hypertension     PAST SURGICAL HISTORY:  History reviewed. No pertinent surgical history.  SOCIAL HISTORY:   Social History   Tobacco Use   Smoking status: Every Day    Current packs/day: 1.00    Average packs/day: 1 pack/day for 40.0 years (40.0 ttl pk-yrs)    Types: Cigarettes   Smokeless tobacco: Never  Substance Use Topics   Alcohol use: No    FAMILY HISTORY:   Family History  Problem Relation Age of Onset   Breast cancer Maternal Aunt    Breast cancer Cousin     DRUG ALLERGIES:   Allergies  Allergen Reactions   Sulfamethoxazole Itching    REVIEW OF SYSTEMS:   ROS As per history of present illness. All pertinent systems were reviewed above. Constitutional, HEENT, cardiovascular, respiratory, GI, GU, musculoskeletal, neuro, psychiatric, endocrine, integumentary and hematologic systems were reviewed and are otherwise negative/unremarkable except for positive findings mentioned above in the HPI.   MEDICATIONS AT HOME:   Prior to Admission medications   Medication Sig Start Date End Date Taking? Authorizing Provider  albuterol  (PROVENTIL  HFA;VENTOLIN  HFA) 108 (90 Base) MCG/ACT inhaler Inhale 1-2 puffs into the lungs every 6 (six) hours as needed for wheezing or shortness of breath.   Yes [provider]  budesonide-formoterol (SYMBICORT) 160-4.5 MCG/ACT inhaler Inhale 2 puffs into the lungs 2 (two) times daily.  Yes [provider]  budesonide-glycopyrrolate-formoterol (BREZTRI) 160-9-4.8 MCG/ACT AERO inhaler Inhale 2 puffs into the lungs in the morning and at bedtime. 08/23/21  Yes [provider]  carvedilol  (COREG ) 12.5 MG tablet Take 12.5 mg by mouth 2 (two) times daily with a meal. 03/29/23 02/11/24 Yes [provider]  ipratropium-albuterol  (DUONEB) 0.5-2.5 (3) MG/3ML SOLN Take 3 mLs by  nebulization every 6 (six) hours as needed. 07/25/23  Yes [provider]  losartan  (COZAAR ) 100 MG tablet Take 100 mg by mouth daily.   Yes [provider]  metoprolol  succinate (TOPROL -XL) 50 MG 24 hr tablet Take 50 mg by mouth daily.   Yes [provider]  rosuvastatin  (CRESTOR ) 10 MG tablet Take 10 mg by mouth every evening.   Yes [provider]  aspirin  EC 81 MG tablet Take 81 mg by mouth daily.    [provider]  isosorbide  mononitrate (IMDUR ) 30 MG 24 hr tablet Take 30 mg by mouth daily. Patient not taking: Reported on 02/04/2024    [provider]  metoprolol  tartrate (LOPRESSOR ) 25 MG tablet Take 25 mg by mouth 2 (two) times daily. Patient not taking: Reported on 02/04/2024    [provider]  nicotine  (NICODERM CQ  - DOSED IN MG/24 HOURS) 14 mg/24hr patch One patch chest wall daily (okay to substitute generic) Patient not taking: Reported on 02/04/2024 04/26/21   Josette Ade, MD  predniSONE  (DELTASONE ) 20 MG tablet Take 2 tablets (40 mg total) by mouth daily with breakfast. Patient not taking: Reported on 02/04/2024 04/27/21   Josette Ade, MD  tiotropium (SPIRIVA ) 18 MCG inhalation capsule Place 18 mcg into inhaler and inhale daily. Patient not taking: Reported on 02/04/2024    [provider]      VITAL SIGNS:  Blood pressure 104/61, pulse 66, temperature 98.5 F (36.9 C), temperature source Oral, resp. rate 16, SpO2 99%.  PHYSICAL EXAMINATION:  Physical Exam  GENERAL: Acutely ill 68 y.o.-year-old patient lying in the bed with moderate respiratory distress with conversational dyspnea, initially on BiPAP and later on a nasal cannula. EYES: Pupils equal, round, reactive to light and accommodation. No scleral icterus. Extraocular muscles intact.  HEENT: Head atraumatic, normocephalic. Oropharynx and nasopharynx clear.  NECK:  Supple, no jugular venous distention. No thyroid enlargement, no tenderness.   LUNGS: Diffuse expiratory wheezes with diminished expiratory airflow and harsh vesicular breathing as well as bibasal rales.. No use of accessory muscles of respiration.  CARDIOVASCULAR: Regular rate and rhythm, S1, S2 normal. No murmurs, rubs, or gallops.  ABDOMEN: Soft, nondistended, nontender. Bowel sounds present. No organomegaly or mass.  EXTREMITIES: 1+ bilateral lower extremity pitting edema with no cyanosis, or clubbing.  NEUROLOGIC: Cranial nerves II through XII are intact. Muscle strength 5/5 in all extremities. Sensation intact. Gait not checked.  PSYCHIATRIC: The patient is alert and oriented x 3.  Normal affect and good eye contact. SKIN: No obvious rash, lesion, or ulcer.   LABORATORY PANEL:   CBC Recent Labs  Lab 02/04/24 0027  WBC 8.0  HGB 14.1  HCT 42.1  PLT 230   ------------------------------------------------------------------------------------------------------------------  Chemistries  Recent Labs  Lab 02/04/24 0027  NA 140  K 4.0  CL 108  CO2 26  GLUCOSE 114*  BUN 17  CREATININE 0.76  CALCIUM  8.6*   ------------------------------------------------------------------------------------------------------------------  Cardiac Enzymes No results for input(s): TROPONINI in the last 168 hours. ------------------------------------------------------------------------------------------------------------------  RADIOLOGY:  DG Chest Portable 1 View Result Date: 02/04/2024 CLINICAL DATA:  Shortness of breath with hypoxia.  EXAM: PORTABLE CHEST 1 VIEW COMPARISON:  Chest PA Lat 04/25/2021. FINDINGS: The heart is upper limit of normal in size. The mediastinum is normally. There is aortic atherosclerosis. The lungs are mildly emphysematous. Increased interstitial markings in the bases could be due to interstitial pneumonitis or edema. Central vessels appear normal in caliber. There are small pleural effusions. Thoracic cage is intact. IMPRESSION: 1. Increased  interstitial markings in the bases, could be due to interstitial pneumonitis or edema. 2. Small pleural effusions. 3. Aortic atherosclerosis. 4. Emphysema. Electronically Signed   By: Francis Quam M.D.   On: 02/04/2024 01:19      IMPRESSION AND PLAN:  Assessment and Plan: * Acute respiratory failure with hypoxia (HCC) - This is likely secondary to acute exacerbation of COPD as well as acute CHF possibly diastolic. - The patient will be admitted to a progressive unit bed. - She was initially placed on BiPAP and later tapered to nasal cannula. - Management otherwise as below.  COPD with acute exacerbation (HCC) - We will place the patient IV steroid therapy with IV Solu-Medrol  as well as nebulized bronchodilator therapy with duonebs q.i.d. and q.4 hours p.r.n.SABRA - Mucolytic therapy will be provided with Mucinex  and antibiotic therapy with IV Rocephin . - O2 protocol will be followed. - Will continue Spiriva . - Will hold off long-acting beta agonist.   Acute CHF (HCC) - This is possibly acute diastolic CHF. - She will be diuresed with  IV Lasix . - Will follow serial troponins. - O2 protocol will be followed. - Will continue Cozaar  and Toprol -XL.  Dyslipidemia - Will continue statin therapy.  Essential hypertension - Will continue antihypertensive therapy.  Tobacco abuse She will have counseling for smoking cessation.   DVT prophylaxis: Lovenox . Advanced Care Planning:  Code Status: full code. Family Communication:  The plan of care was discussed in details with the patient (and family). I answered all questions. The patient agreed to proceed with the above mentioned plan. Further management will depend upon hospital course. Disposition Plan: Back to previous home environment Consults called: [facility]. All the records are reviewed and case discussed with ED provider.  Status is: Inpatient   At the time of the admission, it appears that the appropriate admission status for  this patient is inpatient.  This is judged to be reasonable and necessary in order to provide the required intensity of service to ensure the patient's safety given the presenting symptoms, physical exam findings and initial radiographic and laboratory data in the context of comorbid conditions.  The patient requires inpatient status due to high intensity of service, high risk of further deterioration and high frequency of surveillance required.  I certify that at the time of admission, it is my clinical judgment that the patient will require inpatient hospital care extending more than 2 midnights.                            Dispo: The patient is from: Home              Anticipated d/c is to: Home              Patient currently is not medically stable to d/c.              Difficult to place patient: No Authorized and performed by: Madison Peaches, MD Total critical care time: 50      minutes. Due to a high probability of clinically significant, life-threatening deterioration, the  patient required my highest level of preparedness to intervene emergently and I personally spent this critical care time directly and personally managing the patient.  This critical care time included obtaining a history, examining the patient, pulse oximetry, ordering and review of studies, arranging urgent treatment with development of management plan, evaluation of patient's response to treatment, frequent reassessment, and discussions with other providers. This critical care time was performed to assess and manage the high probability of imminent, life-threatening deterioration that could result in multiorgan failure.  It was exclusive of separately billable procedures and treating other patients and teaching time.   Madison DELENA Peaches M.D on 02/04/2024 at 6:33 AM  [facility]   From 7 PM-7 AM, contact night-coverage [email or web address]  CC: Primary care physician; Center, [facility]

## 2024-02-04 NOTE — ED Provider Notes (Signed)
 [facility] Provider Note    Event Date/Time   First MD Initiated Contact with Patient 02/04/24 0021     (approximate)   History   Respiratory Distress   HPI  Kristi Adams is a 68 y.o. female with history of hypertension, hyperlipidemia, COPD, hypertrophic cardiomyopathy not on oxygen who presents to the emergency department shortness of breath that started suddenly tonight.  EMS reports she was tripoding on arrival and sats were 78% on room air.  Complains of chest tightness.  No fevers or cough.  No calf tenderness or calf swelling.  She denies history of CHF.  Placed on CPAP with EMS and received 2 albuterol  treatments, 1 DuoNeb and 125 mg of IV Solu-Medrol .   History provided by patient, EMS.    Past Medical History:  Diagnosis Date   COPD (chronic obstructive pulmonary disease) (HCC)    Hypertension     History reviewed. No pertinent surgical history.  MEDICATIONS:  Prior to Admission medications   Medication Sig Start Date End Date Taking? Authorizing Provider  albuterol  (PROVENTIL  HFA;VENTOLIN  HFA) 108 (90 Base) MCG/ACT inhaler Inhale 1-2 puffs into the lungs every 6 (six) hours as needed for wheezing or shortness of breath.    [provider]  aspirin  EC 81 MG tablet Take 81 mg by mouth daily.    [provider]  budesonide-formoterol (SYMBICORT) 160-4.5 MCG/ACT inhaler Inhale 2 puffs into the lungs 2 (two) times daily.    [provider]  isosorbide  mononitrate (IMDUR ) 30 MG 24 hr tablet Take 30 mg by mouth daily.    [provider]  losartan  (COZAAR ) 100 MG tablet Take 100 mg by mouth daily.    [provider]  metoprolol  tartrate (LOPRESSOR ) 25 MG tablet Take 25 mg by mouth 2 (two) times daily.    [provider]  nicotine  (NICODERM CQ  - DOSED IN MG/24 HOURS) 14 mg/24hr patch One patch chest wall daily (okay to substitute generic) 04/26/21   Josette Ade, MD  predniSONE   (DELTASONE ) 20 MG tablet Take 2 tablets (40 mg total) by mouth daily with breakfast. 04/27/21   Josette Ade, MD  rosuvastatin  (CRESTOR ) 10 MG tablet Take 10 mg by mouth every evening.    [provider]  tiotropium (SPIRIVA ) 18 MCG inhalation capsule Place 18 mcg into inhaler and inhale daily.    [provider]    Physical Exam   Triage Vital Signs: ED Triage Vitals [02/04/24 0022]  Encounter Vitals Group     BP      Girls Systolic BP Percentile      Girls Diastolic BP Percentile      Boys Systolic BP Percentile      Boys Diastolic BP Percentile      Pulse Rate 88     Resp      Temp      Temp src      SpO2 99 %     Weight      Height      Head Circumference      Peak Flow      Pain Score 0     Pain Loc      Pain Education      Exclude from Growth Chart     Most recent vital signs: Vitals:   02/04/24 0030 02/04/24 0123  BP: 138/83   Pulse: 83   Resp: (!) 23   Temp:  97.6 F (36.4 C)  SpO2: 100%  CONSTITUTIONAL: Alert, responds appropriately to questions.  In respiratory distress HEAD: Normocephalic, atraumatic EYES: Conjunctivae clear, pupils appear equal, sclera nonicteric ENT: normal nose; moist mucous membranes NECK: Supple, normal ROM CARD: Regular and tachycardic; S1 and S2 appreciated RESP: Diminished aeration diffusely, only able to speak 1-2 words at a time, moderate to severe respiratory distress, tachypneic, on CPAP ABD/GI: Non-distended; soft, non-tender, no rebound, no guarding, no peritoneal signs BACK: The back appears normal EXT: Normal ROM in all joints; no deformity noted, no edema, no calf tenderness or calf swelling SKIN: Normal color for age and race; warm; no rash on exposed skin NEURO: Moves all extremities equally, normal speech PSYCH: The patient's mood and manner are appropriate.   ED Results / Procedures / Treatments   LABS: (all labs ordered are listed, but only abnormal results are displayed) Labs  Reviewed  BASIC METABOLIC PANEL WITH GFR - Abnormal; Notable for the following components:      Result Value   Glucose, Bld 114 (*)    Calcium  8.6 (*)    All other components within normal limits  BRAIN NATRIURETIC PEPTIDE - Abnormal; Notable for the following components:   B Natriuretic Peptide 297.9 (*)    All other components within normal limits  BLOOD GAS, VENOUS - Abnormal; Notable for the following components:   pO2, Ven 220 (*)    Bicarbonate 30.4 (*)    Acid-Base Excess 2.8 (*)    All other components within normal limits  RESP PANEL BY RT-PCR (RSV, FLU A&B, COVID)  RVPGX2  CBC WITH DIFFERENTIAL/PLATELET  HIV ANTIBODY (ROUTINE TESTING W REFLEX)  BASIC METABOLIC PANEL WITH GFR  CBC  TROPONIN I (HIGH SENSITIVITY)  TROPONIN I (HIGH SENSITIVITY)     EKG:  EKG Interpretation Date/Time:  Monday February 04 2024 00:26:16 EDT Ventricular Rate:  87 PR Interval:  185 QRS Duration:  82 QT Interval:  393 QTC Calculation: 473 R Axis:   12  Text Interpretation: Sinus rhythm Biatrial enlargement RSR' in V1 or V2, right VCD or RVH Probable LVH with secondary repol abnrm Anterior Q waves, possibly due to LVH ST elevation, consider inferior injury Confirmed by Neomi Neptune [phone]) on 02/04/2024 12:52:26 AM         RADIOLOGY: My personal review and interpretation of imaging: Chest x-ray shows pulmonary edema.  I have personally reviewed all radiology reports.   DG Chest Portable 1 View Result Date: 02/04/2024 CLINICAL DATA:  Shortness of breath with hypoxia. EXAM: PORTABLE CHEST 1 VIEW COMPARISON:  Chest PA Lat 04/25/2021. FINDINGS: The heart is upper limit of normal in size. The mediastinum is normally. There is aortic atherosclerosis. The lungs are mildly emphysematous. Increased interstitial markings in the bases could be due to interstitial pneumonitis or edema. Central vessels appear normal in caliber. There are small pleural effusions. Thoracic cage is intact. IMPRESSION: 1.  Increased interstitial markings in the bases, could be due to interstitial pneumonitis or edema. 2. Small pleural effusions. 3. Aortic atherosclerosis. 4. Emphysema. Electronically Signed   By: Francis Quam M.D.   On: 02/04/2024 01:19     PROCEDURES:  Critical Care performed: Yes, see critical care procedure note(s)   CRITICAL CARE Performed by: Neptune Bhavya Grand   Total critical care time: 30 minutes  Critical care time was exclusive of separately billable procedures and treating other patients.  Critical care was necessary to treat or prevent imminent or life-threatening deterioration.  Critical care was time spent personally by me on the following activities: development of treatment  plan with patient and/or surrogate as well as nursing, discussions with consultants, evaluation of patient's response to treatment, examination of patient, obtaining history from patient or surrogate, ordering and performing treatments and interventions, ordering and review of laboratory studies, ordering and review of radiographic studies, pulse oximetry and re-evaluation of patient's condition.   SABRA1-3 Lead EKG Interpretation  Performed by: Shirleen Mcfaul, Josette SAILOR, DO Authorized by: Clebert Wenger, Josette SAILOR, DO     Interpretation: normal     ECG rate:  83   ECG rate assessment: normal     Rhythm: sinus rhythm     Ectopy: none     Conduction: normal       IMPRESSION / MDM / ASSESSMENT AND PLAN / ED COURSE  I reviewed the triage vital signs and the nursing notes.    Patient here with sudden onset shortness of breath, chest tightness and is hypoxic.  The patient is on the cardiac monitor to evaluate for evidence of arrhythmia and/or significant heart rate changes.   DIFFERENTIAL DIAGNOSIS (includes but not limited to):   COPD exacerbation, CHF exacerbation, pneumonia, pneumothorax, ACS, PE   Patient's presentation is most consistent with acute presentation with potential threat to life or bodily  function.   PLAN: Will obtain labs, chest x-ray.  Will transition to BiPAP.  Will obtain VBG.  Will give breathing treatments, IV magnesium .  Patient will need admission given new oxygen requirement.   MEDICATIONS GIVEN IN ED: Medications  aspirin  EC tablet 81 mg (has no administration in time range)  losartan  (COZAAR ) tablet 100 mg (has no administration in time range)  nicotine  (NICODERM CQ  - dosed in mg/24 hours) patch 14 mg (has no administration in time range)  enoxaparin  (LOVENOX ) injection 40 mg (has no administration in time range)  furosemide  (LASIX ) injection 40 mg (has no administration in time range)  acetaminophen  (TYLENOL ) tablet 650 mg (has no administration in time range)    Or  acetaminophen  (TYLENOL ) suppository 650 mg (has no administration in time range)  traZODone  (DESYREL ) tablet 25 mg (has no administration in time range)  magnesium  hydroxide (MILK OF MAGNESIA) suspension 30 mL (has no administration in time range)  ondansetron  (ZOFRAN ) tablet 4 mg (has no administration in time range)    Or  ondansetron  (ZOFRAN ) injection 4 mg (has no administration in time range)  ipratropium-albuterol  (DUONEB) 0.5-2.5 (3) MG/3ML nebulizer solution 3 mL (has no administration in time range)  guaiFENesin  (MUCINEX ) 12 hr tablet 600 mg (600 mg Oral Not Given 02/04/24 0234)  chlorpheniramine-HYDROcodone (TUSSIONEX) 10-8 MG/5ML suspension 5 mL (has no administration in time range)  cefTRIAXone  (ROCEPHIN ) 1 g in sodium chloride  0.9 % 100 mL IVPB (has no administration in time range)  metoprolol  succinate (TOPROL -XL) 24 hr tablet 50 mg (has no administration in time range)  ipratropium-albuterol  (DUONEB) 0.5-2.5 (3) MG/3ML nebulizer solution 3 mL (3 mLs Nebulization Given 02/04/24 0123)  magnesium  sulfate IVPB 2 g 50 mL (0 g Intravenous Stopped 02/04/24 0152)  furosemide  (LASIX ) injection 40 mg (40 mg Intravenous Given 02/04/24 0152)     ED COURSE: Patient improving clinically on BiPAP.   VBG reassuring.  Troponin negative.  BNP 287.  Chest x-ray reviewed and interpreted by myself and the radiologist and shows pulmonary edema.  Will give IV Lasix .  Will discuss with hospitalist for admission.   CONSULTS:  Consulted and discussed patient's case with hospitalist, Dr. Lawence.  I have recommended admission and consulting physician agrees and will place admission orders.  Patient (and family if  present) agree with this plan.   I reviewed all nursing notes, vitals, pertinent previous records.  All labs, EKGs, imaging ordered have been independently reviewed and interpreted by myself.    OUTSIDE RECORDS REVIEWED: Reviewed patient's previous cardiology note.  Patient has history of hypertrophic cardiomyopathy.       FINAL CLINICAL IMPRESSION(S) / ED DIAGNOSES   Final diagnoses:  COPD exacerbation (HCC)  Acute respiratory failure with hypoxia (HCC)  Acute on chronic congestive heart failure, unspecified heart failure type (HCC)     Rx / DC Orders   ED Discharge Orders     None        Note:  This document was prepared using Dragon voice recognition software and may include unintentional dictation errors.   Juda Toepfer, Josette SAILOR, DO 02/04/24 [phone]

## 2024-02-04 NOTE — TOC Progression Note (Signed)
 Transition of Care [facility]) - Progression Note    Patient Details  Name: Kristi Adams MRN: [id number] Date of Birth: 11/03/1955  Transition of Care [facility]) CM/SW Contact  Marinda Cooks, RN Phone Number: 02/04/2024, 2:41 PM  Clinical Narrative:    Per chart review pt has (+) UDS this CM provided substance abuse resources on AVS for pt to review. TOC will cont to follow dc planning/ care coordination and update as applicable.                     Expected Discharge Plan and Services                                               Social Drivers of Health (SDOH) Interventions SDOH Screenings   Food Insecurity: No Food Insecurity (01/22/2021)   Received from [facility]  Transportation Needs: No Transportation Needs (01/22/2021)   Received from [facility]  Financial Resource Strain: Low Risk  (01/22/2021)   Received from [facility]  Tobacco Use: High Risk (02/04/2024)    Readmission Risk Interventions     No data to display

## 2024-02-04 NOTE — Assessment & Plan Note (Signed)
 UDS positive for opiates and cocaine. - Patient was counseled

## 2024-02-04 NOTE — Assessment & Plan Note (Addendum)
 History of HCOM. Concern of acute on chronic HFpEF, echocardiogram with hyperdynamic EF and grade 1 diastolic dysfunction.  Moderate LV outlet obstruction which is consistent with her history of HCOM. BNP of 297.  Clinically appears euvolemic and received a dose of IV Lasix  -Further IV Lasix  was discontinued by cardiology to prevent volume depletion with underlying HCOM - Will continue Cozaar  and Toprol -XL. -Patient should avoid cocaine with current underlying cardiac condition.

## 2024-02-05 ENCOUNTER — Other Ambulatory Visit: Payer: Self-pay

## 2024-02-05 DIAGNOSIS — J9601 Acute respiratory failure with hypoxia: Secondary | ICD-10-CM | POA: Diagnosis not present

## 2024-02-05 DIAGNOSIS — J441 Chronic obstructive pulmonary disease with (acute) exacerbation: Secondary | ICD-10-CM | POA: Diagnosis not present

## 2024-02-05 DIAGNOSIS — I10 Essential (primary) hypertension: Secondary | ICD-10-CM | POA: Diagnosis not present

## 2024-02-05 DIAGNOSIS — I421 Obstructive hypertrophic cardiomyopathy: Secondary | ICD-10-CM

## 2024-02-05 DIAGNOSIS — I509 Heart failure, unspecified: Secondary | ICD-10-CM | POA: Diagnosis not present

## 2024-02-05 DIAGNOSIS — I5033 Acute on chronic diastolic (congestive) heart failure: Secondary | ICD-10-CM | POA: Diagnosis not present

## 2024-02-05 MED ORDER — PREDNISONE 50 MG PO TABS
50.0000 mg | ORAL_TABLET | Freq: Every day | ORAL | 0 refills | Status: AC
  Filled 2024-02-05: qty 4, 4d supply, fill #0

## 2024-02-05 MED ORDER — IPRATROPIUM-ALBUTEROL 0.5-2.5 (3) MG/3ML IN SOLN
3.0000 mL | Freq: Three times a day (TID) | RESPIRATORY_TRACT | Status: DC
  Administered 2024-02-05: 3 mL via RESPIRATORY_TRACT
  Filled 2024-02-05: qty 3

## 2024-02-05 MED ORDER — FUROSEMIDE 20 MG PO TABS
20.0000 mg | ORAL_TABLET | Freq: Every day | ORAL | 11 refills | Status: AC
  Filled 2024-02-05: qty 30, 30d supply, fill #0

## 2024-02-05 MED ORDER — CARVEDILOL 12.5 MG PO TABS
12.5000 mg | ORAL_TABLET | Freq: Two times a day (BID) | ORAL | 1 refills | Status: AC
  Filled 2024-02-05: qty 60, 30d supply, fill #0

## 2024-02-05 MED ORDER — GUAIFENESIN ER 600 MG PO TB12: 600.0000 mg | ORAL_TABLET | Freq: Two times a day (BID) | ORAL | 0 refills | Status: AC | PRN

## 2024-02-05 MED ORDER — PREDNISONE 50 MG PO TABS: 50.0000 mg | ORAL_TABLET | Freq: Every day | ORAL | 0 refills | Status: DC

## 2024-02-05 NOTE — TOC CM/SW Note (Signed)
 Transition of Care [facility]) - Inpatient Brief Assessment   Patient Details  Name: Kristi Adams MRN: [id number] Date of Birth: [birth year]  Transition of Care [facility]) CM/SW Contact:    Lauraine JAYSON Carpen, LCSW Phone Number: 02/05/2024, 11:09 AM   Clinical Narrative: Patient has orders to discharge home today. Chart reviewed. SDOH flag for utilities. Resources added to AVS. RN confirmed she did not qualify for home oxygen. No further concerns. CSW signing off.  Transition of Care Asessment: Insurance and Status: Insurance coverage has been reviewed Patient has primary care physician: Yes Home environment has been reviewed: Single family home Prior level of function:: Not documented Prior/Current Home Services: No current home services Social Drivers of Health Review: SDOH reviewed interventions complete Readmission risk has been reviewed: Yes Transition of care needs: no transition of care needs at this time

## 2024-02-05 NOTE — Discharge Summary (Signed)
 Physician Discharge Summary   Patient: Kristi Adams MRN: [id number] DOB: [birth year]  Admit date:     02/04/2024  Discharge date: 02/05/24  Discharge Physician: Amaryllis Dare   PCP: Center, [facility]   Recommendations at discharge:  Please obtain CBC and BMP and follow-up Please continue counseling for cocaine abuse, patient also has an history of HOCM. Her home metoprolol  was discontinued, both carvedilol  and metoprolol  was listed in her home medication, she was also started on low-dose Lasix . Please ensure completion of prednisone  course for COPD exacerbation. Please continue counseling for smoking and substance abuse. Follow-up with primary care provider Follow-up with cardiology  Discharge Diagnoses: Principal Problem:   Acute respiratory failure with hypoxia (HCC) Active Problems:   COPD with acute exacerbation (HCC)   Acute on chronic heart failure with preserved ejection fraction (HFpEF) (HCC)   Tobacco abuse   Essential hypertension   Dyslipidemia   Substance abuse [facility])   Hospital Course: Partly taken from H&P.  Kristi Adams is a 68 y.o. female with medical history significant for COPD and essential hypertension, who presented to the emergency room, who presented to the emergency room with acute onset of worsening dyspnea with associated cough with inability to expectorate as well as wheezing over the last couple weeks.  She has been having intermittent chills but denied any measured fever.  She also admits orthopnea and PND along with dyspnea on exertion and worsening lower extremity edema.  On presentation patient with mildly elevated blood pressure and tachypneic, she was on CPAP which was later transitioned to BiPAP. Labs with BNP of 297, troponin 13>> 15, rest of the labs mostly unremarkable, respiratory panel negative.  UA negative for UTI, UDS positive for cocaine and opiates. CXR with increased interstitial markings in the bases that could be due  to interstitial pneumonitis or edema, small pleural effusions, aortic atherosclerosis and emphysema.  Patient was giving breathing treatment and IV Lasix  in ED.  8/25: Patient was transitioned to 2 L of oxygen, discontinuing ceftriaxone  as there is no evidence of infection.  No orthopnea today.  Switching Solu-Medrol  with prednisone  from tomorrow. Echocardiogram with hyperdynamic function at 70 to 75%, no regional wall motion abnormalities, grade 1 diastolic dysfunction and moderate LVH, more pronounced in the septum, with moderate LV outlet obstruction consistent with with her history of HOCM. Further diuresis was discontinued by cardiology to avoid volume depletion with underlying HCOM.  8/26: Hemodynamically stable, able to wean back to room air.  Patient was ambulated without any significant desaturation.  Scant expiratory wheeze on exam.  Discussed with cardiology and they are recommending starting low-dose Lasix  at 20 mg daily, both carvedilol  and metoprolol  was listed in her chart.  Metoprolol  was discontinued and she will continue with carvedilol .  UDS was positive for cocaine.  She was counseled again as it can be lethal with her history of HOCM.  Patient was given 4 more days of prednisone  and she will continue her home bronchodilators.  Patient was instructed to follow-up with her providers including her cardiologist as outpatient for further assistance.  Assessment and Plan: * Acute respiratory failure with hypoxia (HCC) This is likely secondary to acute exacerbation of COPD, might have some element of acute on chronic HFpEF.  No baseline oxygen use and required BiPAP transiently,  Patient now has been transitioned back to room air.  COPD with acute exacerbation (HCC) Significant wheezing on presentation, seems improving. Discontinuing ceftriaxone  as there is no concern of pneumonia. Respiratory panel was  negative for COVID, RSV and influenza -Respiratory viral panel negative -  Patient was given prednisone  for 4 more days - Continue with bronchodilators   Acute on chronic heart failure with preserved ejection fraction (HFpEF) (HCC) History of HCOM. Concern of acute on chronic HFpEF, echocardiogram with hyperdynamic EF and grade 1 diastolic dysfunction.  Moderate LV outlet obstruction which is consistent with her history of HCOM. BNP of 297.  Clinically appears euvolemic and received a dose of IV Lasix  -Further IV Lasix  was discontinued by cardiology to prevent volume depletion with underlying HCOM -Patient will continue with low-dose carvedilol  and metoprolol  was discontinued, low-dose Lasix  was added after discussing with cardiology. -Patient should avoid cocaine with current underlying cardiac condition.  Substance abuse (HCC) UDS positive for opiates and cocaine. - Patient was counseled  Dyslipidemia - Will continue statin therapy.  Essential hypertension - Continue Cozaar  and metoprolol   Tobacco abuse She will have counseling for smoking cessation. - Nicotine  patch as needed  Consultants: Cardiology Procedures performed: None Disposition: Home Diet recommendation:  Discharge Diet Orders (From admission, onward)     Start     Ordered   02/05/24 0000  Diet - low sodium heart healthy        02/05/24 1102           Cardiac diet DISCHARGE MEDICATION: Allergies as of 02/05/2024       Reactions   Sulfamethoxazole Itching        Medication List     STOP taking these medications    isosorbide  mononitrate 30 MG 24 hr tablet Commonly known as: IMDUR    metoprolol  succinate 50 MG 24 hr tablet Commonly known as: TOPROL -XL   metoprolol  tartrate 25 MG tablet Commonly known as: LOPRESSOR    tiotropium 18 MCG inhalation capsule Commonly known as: SPIRIVA        TAKE these medications    albuterol  108 (90 Base) MCG/ACT inhaler Commonly known as: VENTOLIN  HFA Inhale 1-2 puffs into the lungs every 6 (six) hours as needed for wheezing  or shortness of breath.   aspirin  EC 81 MG tablet Take 81 mg by mouth daily.   budesonide-formoterol 160-4.5 MCG/ACT inhaler Commonly known as: SYMBICORT Inhale 2 puffs into the lungs 2 (two) times daily.   budesonide-glycopyrrolate-formoterol 160-9-4.8 MCG/ACT Aero inhaler Commonly known as: BREZTRI Inhale 2 puffs into the lungs in the morning and at bedtime.   carvedilol  12.5 MG tablet Commonly known as: COREG  Take 1 tablet (12.5 mg total) by mouth 2 (two) times daily with a meal.   furosemide  20 MG tablet Commonly known as: Lasix  Take 1 tablet (20 mg total) by mouth daily.   guaiFENesin  600 MG 12 hr tablet Commonly known as: MUCINEX  Take 1 tablet (600 mg total) by mouth 2 (two) times daily as needed.   ipratropium-albuterol  0.5-2.5 (3) MG/3ML Soln Commonly known as: DUONEB Take 3 mLs by nebulization every 6 (six) hours as needed.   losartan  100 MG tablet Commonly known as: COZAAR  Take 100 mg by mouth daily.   nicotine  14 mg/24hr patch Commonly known as: NICODERM CQ  - dosed in mg/24 hours One patch chest wall daily (okay to substitute generic)   predniSONE  50 MG tablet Commonly known as: DELTASONE  Take 1 tablet (50 mg total) by mouth daily with breakfast for 4 days. What changed:  medication strength how much to take   rosuvastatin  10 MG tablet Commonly known as: CRESTOR  Take 10 mg by mouth every evening.        Follow-up Information  Ezzard Mliss HERO, NP. Go on 02/28/2024.   Specialty: Cardiology Why: Hospital Follow-Up 02/28/24 @ 9:45 AM with Mliss Ezzard Cardiology [facility] information: [address] [place] [address] [phone]         Center, [facility]. Schedule an appointment as soon as possible for a visit in 1 week(s).   Specialty: General Practice Why: Please have patient book the appointment Contact information: [address] [place] [address] [phone]                Discharge  Exam: Filed Weights   02/04/24 0700 02/04/24 1523 02/05/24 0500  Weight: 62.1 kg 62.1 kg 62.1 kg   General.  Well-developed lady, in no acute distress. Pulmonary.  Scant scattered wheeze bilaterally, normal respiratory effort. CV.  Regular rate and rhythm, no JVD, rub or murmur. Abdomen.  Soft, nontender, nondistended, BS positive. CNS.  Alert and oriented .  No focal neurologic deficit. Extremities.  No edema, no cyanosis, pulses intact and symmetrical. Psychiatry.  Judgment and insight appears normal.   Condition at discharge: stable  The results of significant diagnostics from this hospitalization (including imaging, microbiology, ancillary and laboratory) are listed below for reference.   Imaging Studies: ECHOCARDIOGRAM COMPLETE Result Date: 02/04/2024    ECHOCARDIOGRAM REPORT   Patient Name:   Kristi Adams Date of Exam: 02/04/2024 Medical Rec #:  [id number]       Height:       60.0 in Accession #:    [id number]      Weight:       162.0 lb Date of Birth:  June 24, 1955        BSA:          1.707 m Patient Age:    68 years        BP:           88/57 mmHg Patient Gender: F               HR:           64 bpm. Exam Location:  [facility] Procedure: 2D Echo, Color Doppler and Cardiac Doppler (Both Spectral and Color            Flow Doppler were utilized during procedure). Indications:     CHF-Acute Diastolic I50.31  History:         Patient has no prior history of Echocardiogram examinations.  Sonographer:     Ashley McNeely-Sloane Referring Phys:  [id number] JAN A MANSY Diagnosing Phys: Deatrice Cage MD IMPRESSIONS  1. Left ventricular ejection fraction, by estimation, is 70 to 75%. The left ventricle has hyperdynamic function. The left ventricle has no regional wall motion abnormalities. There is moderate left ventricular hypertrophy. Left ventricular diastolic parameters are consistent with Grade I diastolic dysfunction (impaired relaxation). LV hypertrophy is more pronounced in the septum with evidence of  significant LVOT gradient ( Peak systolic gradient of 64 mm Hg at rest).  2. Right ventricular systolic function is normal. The right ventricular size is normal.  3. The mitral valve is normal in structure. Mild mitral valve regurgitation. No evidence of mitral stenosis.  4. The aortic valve is normal in structure. Aortic valve regurgitation is not visualized. No aortic stenosis is present.  5. The inferior vena cava is normal in size with greater than 50% respiratory variability, suggesting right atrial pressure of 3 mmHg. Conclusion(s)/Recommendation(s): Findings consistent with hypertrophic obstructive cardiomyopathy. FINDINGS  Left Ventricle: Left ventricular ejection fraction, by estimation,  is 70 to 75%. The left ventricle has hyperdynamic function. The left ventricle has no regional wall motion abnormalities. The left ventricular internal cavity size was normal in size. There is moderate left ventricular hypertrophy. Left ventricular diastolic parameters are consistent with Grade I diastolic dysfunction (impaired relaxation). Right Ventricle: The right ventricular size is normal. No increase in right ventricular wall thickness. Right ventricular systolic function is normal. Left Atrium: Left atrial size was normal in size. Right Atrium: Right atrial size was normal in size. Pericardium: There is no evidence of pericardial effusion. Mitral Valve: The mitral valve is normal in structure. Mild mitral valve regurgitation. No evidence of mitral valve stenosis. MV peak gradient, 5.4 mmHg. The mean mitral valve gradient is 2.0 mmHg. Tricuspid Valve: The tricuspid valve is normal in structure. Tricuspid valve regurgitation is not demonstrated. No evidence of tricuspid stenosis. Aortic Valve: The aortic valve is normal in structure. Aortic valve regurgitation is not visualized. No aortic stenosis is present. Aortic valve mean gradient measures 10.0 mmHg. Aortic valve peak gradient measures 19.9 mmHg. Aortic valve  area, by VTI measures 2.42 cm. Pulmonic Valve: The pulmonic valve was normal in structure. Pulmonic valve regurgitation is not visualized. No evidence of pulmonic stenosis. Aorta: The aortic root is normal in size and structure. Venous: The inferior vena cava is normal in size with greater than 50% respiratory variability, suggesting right atrial pressure of 3 mmHg. IAS/Shunts: No atrial level shunt detected by color flow Doppler.  LEFT VENTRICLE PLAX 2D LVIDd:         4.20 cm     Diastology LVIDs:         2.90 cm     LV e' medial:    5.59 cm/s LV PW:         1.60 cm     LV E/e' medial:  8.4 LV IVS:        1.10 cm     LV e' lateral:   5.27 cm/s LVOT diam:     2.00 cm     LV E/e' lateral: 8.9 LV SV:         109 LV SV Index:   64 LVOT Area:     3.14 cm  LV Volumes (MOD) LV vol d, MOD A2C: 69.3 ml LV vol d, MOD A4C: 57.4 ml LV vol s, MOD A2C: 28.2 ml LV vol s, MOD A4C: 23.0 ml LV SV MOD A2C:     41.1 ml LV SV MOD A4C:     57.4 ml LV SV MOD BP:      38.1 ml RIGHT VENTRICLE RV Basal diam:  3.20 cm RV Mid diam:    2.30 cm RV S prime:     16.80 cm/s TAPSE (M-mode): 2.4 cm LEFT ATRIUM             Index        RIGHT ATRIUM           Index LA diam:        4.10 cm 2.40 cm/m   RA Area:     12.10 cm LA Vol (A2C):   52.2 ml 30.59 ml/m  RA Volume:   24.70 ml  14.47 ml/m LA Vol (A4C):   45.2 ml 26.48 ml/m LA Biplane Vol: 49.1 ml 28.77 ml/m  AORTIC VALVE                     PULMONIC VALVE AV Area (Vmax):    4.22 cm  PV Vmax:        1.24 m/s AV Area (Vmean):   2.62 cm      PV Vmean:       86.000 cm/s AV Area (VTI):     2.42 cm      PV VTI:         0.295 m AV Vmax:           223.00 cm/s   PV Peak grad:   6.2 mmHg AV Vmean:          144.000 cm/s  PV Mean grad:   3.0 mmHg AV VTI:            0.450 m       RVOT Peak grad: 3 mmHg AV Peak Grad:      19.9 mmHg AV Mean Grad:      10.0 mmHg LVOT Vmax:         299.52 cm/s LVOT Vmean:        120.000 cm/s LVOT VTI:          0.347 m LVOT/AV VTI ratio: 0.77  AORTA Ao Root diam: 2.90  cm MITRAL VALVE MV Area (PHT): 2.07 cm     SHUNTS MV Area VTI:   3.39 cm     Systemic VTI:  0.35 m MV Peak grad:  5.4 mmHg     Systemic Diam: 2.00 cm MV Mean grad:  2.0 mmHg     Pulmonic VTI:  0.226 m MV Vmax:       1.16 m/s MV Vmean:      62.8 cm/s MV Decel Time: 366 msec MV E velocity: 46.90 cm/s MV A velocity: 105.00 cm/s MV E/A ratio:  0.45 Deatrice Cage MD Electronically signed by Deatrice Cage MD Signature Date/Time: 02/04/2024/10:39:37 AM    Final    DG Chest Portable 1 View Result Date: 02/04/2024 CLINICAL DATA:  Shortness of breath with hypoxia. EXAM: PORTABLE CHEST 1 VIEW COMPARISON:  Chest PA Lat 04/25/2021. FINDINGS: The heart is upper limit of normal in size. The mediastinum is normally. There is aortic atherosclerosis. The lungs are mildly emphysematous. Increased interstitial markings in the bases could be due to interstitial pneumonitis or edema. Central vessels appear normal in caliber. There are small pleural effusions. Thoracic cage is intact. IMPRESSION: 1. Increased interstitial markings in the bases, could be due to interstitial pneumonitis or edema. 2. Small pleural effusions. 3. Aortic atherosclerosis. 4. Emphysema. Electronically Signed   By: Francis Quam M.D.   On: 02/04/2024 01:19   US  RT UPPER EXTREM LTD SOFT TISSUE NON VASCULAR Result Date: 02/01/2024 CLINICAL DATA:  Rt aneterior shoulder mass x 3 months EXAM: ULTRASOUND RIGHT UPPER EXTREMITY LIMITED TECHNIQUE: Ultrasound examination of the upper extremity soft tissues was performed in the area of clinical concern. COMPARISON:  None Available. FINDINGS: In the area of concern, Over the right shoulder, anteriorly, In the subcutaneous tissue, there is a well-circumscribed 2.1 x 3.1 x 3.4 cm anechoic structure. The structure exhibits mild internal debris/incomplete septations. Along the posterior aspect of the structure, there is a well-circumscribed 7 x 8 mm isoechoic nonshadowing mural nodule. There is no internal vascularity  on the provided color flow images. This is incompletely characterized on the current exam. The structure does not have classic appearance of an abscess or lipoma. This is indeterminate. Correlate clinically to determine the need for additional imaging. IMPRESSION: *There is a 2.1 x 3.1 x 3.4 cm cystic structure in the area of concern, anteriorly over the right  shoulder. There is a 7 x 8 mm mural nodule along the posterior aspect of the structure. The structure is incompletely characterized on the current exam. Electronically Signed   By: Ree Molt M.D.   On: 02/01/2024 10:40    Microbiology: Results for orders placed or performed during the hospital encounter of 02/04/24  Resp panel by RT-PCR (RSV, Flu A&B, Covid) Anterior Nasal Swab     Status: None   Collection Time: 02/04/24 12:27 AM   Specimen: Anterior Nasal Swab  Result Value Ref Range Status   SARS Coronavirus 2 by RT PCR NEGATIVE NEGATIVE Final    Comment: (NOTE) SARS-CoV-2 target nucleic acids are NOT DETECTED.  The SARS-CoV-2 RNA is generally detectable in upper respiratory specimens during the acute phase of infection. The lowest concentration of SARS-CoV-2 viral copies this assay can detect is 138 copies/mL. A negative result does not preclude SARS-Cov-2 infection and should not be used as the sole basis for treatment or other patient management decisions. A negative result may occur with  improper specimen collection/handling, submission of specimen other than nasopharyngeal swab, presence of viral mutation(s) within the areas targeted by this assay, and inadequate number of viral copies(<138 copies/mL). A negative result must be combined with clinical observations, patient history, and epidemiological information. The expected result is Negative.  Fact Sheet for Patients:  BloggerCourse.com  Fact Sheet for Healthcare Providers:  SeriousBroker.it  This test is no t yet  approved or cleared by the [place]  FDA and  has been authorized for detection and/or diagnosis of SARS-CoV-2 by FDA under an Emergency Use Authorization (EUA). This EUA will remain  in effect (meaning this test can be used) for the duration of the COVID-19 declaration under Section 564(b)(1) of the Act, 21 U.S.C.section 360bbb-3(b)(1), unless the authorization is terminated  or revoked sooner.       Influenza A by PCR NEGATIVE NEGATIVE Final   Influenza B by PCR NEGATIVE NEGATIVE Final    Comment: (NOTE) The Xpert Xpress SARS-CoV-2/FLU/RSV plus assay is intended as an aid in the diagnosis of influenza from Nasopharyngeal swab specimens and should not be used as a sole basis for treatment. Nasal washings and aspirates are unacceptable for Xpert Xpress SARS-CoV-2/FLU/RSV testing.  Fact Sheet for Patients: BloggerCourse.com  Fact Sheet for Healthcare Providers: SeriousBroker.it  This test is not yet approved or cleared by the [place]  FDA and has been authorized for detection and/or diagnosis of SARS-CoV-2 by FDA under an Emergency Use Authorization (EUA). This EUA will remain in effect (meaning this test can be used) for the duration of the COVID-19 declaration under Section 564(b)(1) of the Act, 21 U.S.C. section 360bbb-3(b)(1), unless the authorization is terminated or revoked.     Resp Syncytial Virus by PCR NEGATIVE NEGATIVE Final    Comment: (NOTE) Fact Sheet for Patients: BloggerCourse.com  Fact Sheet for Healthcare Providers: SeriousBroker.it  This test is not yet approved or cleared by the [place]  FDA and has been authorized for detection and/or diagnosis of SARS-CoV-2 by FDA under an Emergency Use Authorization (EUA). This EUA will remain in effect (meaning this test can be used) for the duration of the COVID-19 declaration under Section 564(b)(1) of  the Act, 21 U.S.C. section 360bbb-3(b)(1), unless the authorization is terminated or revoked.  Performed at [facility], [address] [place], [place] [address]   Respiratory (~20 pathogens) panel by PCR     Status: None   Collection Time: 02/04/24  1:17 PM   Specimen: Nasopharyngeal  Swab; Respiratory  Result Value Ref Range Status   Adenovirus NOT DETECTED NOT DETECTED Final   Coronavirus 229E NOT DETECTED NOT DETECTED Final    Comment: (NOTE) The Coronavirus on the Respiratory Panel, DOES NOT test for the novel  Coronavirus (2019 nCoV)    Coronavirus HKU1 NOT DETECTED NOT DETECTED Final   Coronavirus NL63 NOT DETECTED NOT DETECTED Final   Coronavirus OC43 NOT DETECTED NOT DETECTED Final   Metapneumovirus NOT DETECTED NOT DETECTED Final   Rhinovirus / Enterovirus NOT DETECTED NOT DETECTED Final   Influenza A NOT DETECTED NOT DETECTED Final   Influenza B NOT DETECTED NOT DETECTED Final   Parainfluenza Virus 1 NOT DETECTED NOT DETECTED Final   Parainfluenza Virus 2 NOT DETECTED NOT DETECTED Final   Parainfluenza Virus 3 NOT DETECTED NOT DETECTED Final   Parainfluenza Virus 4 NOT DETECTED NOT DETECTED Final   Respiratory Syncytial Virus NOT DETECTED NOT DETECTED Final   Bordetella pertussis NOT DETECTED NOT DETECTED Final   Bordetella Parapertussis NOT DETECTED NOT DETECTED Final   Chlamydophila pneumoniae NOT DETECTED NOT DETECTED Final   Mycoplasma pneumoniae NOT DETECTED NOT DETECTED Final    Comment: Performed at [facility], [address] [place], [place] [address]    Labs: CBC: Recent Labs  Lab 02/04/24 0027 02/04/24 0612  WBC 8.0 8.6  NEUTROABS 5.0  --   HGB 14.1 13.9  HCT 42.1 42.2  MCV 99.8 100.0  PLT 230 213   Basic Metabolic Panel: Recent Labs  Lab 02/04/24 0027 02/04/24 0612  NA 140 141  K 4.0 4.3  CL 108 106  CO2 26 25  GLUCOSE 114* 148*  BUN 17 19  CREATININE 0.76 0.69  CALCIUM  8.6* 8.9   Liver Function Tests: No  results for input(s): AST, ALT, ALKPHOS, BILITOT, PROT, ALBUMIN in the last 168 hours. CBG: No results for input(s): GLUCAP in the last 168 hours.  Discharge time spent: greater than 30 minutes.  This record has been created using Conservation officer, historic buildings. Errors have been sought and corrected,but may not always be located. Such creation errors do not reflect on the standard of care.   Signed: Amaryllis Dare, MD [facility] 02/05/2024

## 2024-02-05 NOTE — Plan of Care (Signed)

## 2024-02-05 NOTE — Progress Notes (Signed)
 Rounding Note   Patient Name: Kristi Adams Date of Encounter: 02/05/2024  Cardiologist: [facility]  Subjective Patient reports improvements in breathing although she remains on 2 L supplemental O2. Euvolemic on exam.  Scheduled Meds:  aspirin  EC  81 mg Oral Daily   enoxaparin  (LOVENOX ) injection  40 mg Subcutaneous Q24H   guaiFENesin   600 mg Oral BID   ipratropium-albuterol   3 mL Nebulization QID   losartan   100 mg Oral Daily   metoprolol  succinate  50 mg Oral Daily   nicotine   14 mg Transdermal Daily   predniSONE   50 mg Oral Q breakfast   rosuvastatin   10 mg Oral QPM   Continuous Infusions:  PRN Meds: acetaminophen  **OR** acetaminophen , chlorpheniramine-HYDROcodone, magnesium  hydroxide, ondansetron  **OR** ondansetron  (ZOFRAN ) IV, traZODone    Vital Signs  Vitals:   02/04/24 2353 02/05/24 0355 02/05/24 0500 02/05/24 0727  BP: 112/70 (!) 103/54  117/67  Pulse: 60 62  (!) 58  Resp: 20 19  20   Temp: 97.7 F (36.5 C) 98.4 F (36.9 C)  97.8 F (36.6 C)  TempSrc: Oral Oral  Oral  SpO2: 100% 95%  99%  Weight:   62.1 kg   Height:        Intake/Output Summary (Last 24 hours) at 02/05/2024 0908 Last data filed at 02/05/2024 0300 Gross per 24 hour  Intake 480 ml  Output 300 ml  Net 180 ml      02/05/2024    5:00 AM 02/04/2024    3:23 PM 02/04/2024    7:00 AM  Last 3 Weights  Weight (lbs) 136 lb 12.8 oz 136 lb 14.5 oz 136 lb 14.5 oz  Weight (kg) 62.052 kg 62.1 kg 62.1 kg      Telemetry Sinus rhythm, rate 50s-60s bpm - Personally Reviewed  Physical Exam  GEN: No acute distress.   Neck: No JVD Cardiac: RRR, 3/6 systolic murmur, no rubs or gallops.  Respiratory: Diminished bilaterally with faint expiratory wheezing GI: Soft, nontender, non-distended  MS: No edema; No deformity. Neuro:  Nonfocal  Psych: Normal affect   Labs High Sensitivity Troponin:   Recent Labs  Lab 02/04/24 0027 02/04/24 0317  TROPONINIHS 13 15     Chemistry Recent Labs  Lab  02/04/24 0027 02/04/24 0612  NA 140 141  K 4.0 4.3  CL 108 106  CO2 26 25  GLUCOSE 114* 148*  BUN 17 19  CREATININE 0.76 0.69  CALCIUM  8.6* 8.9  GFRNONAA >60 >60  ANIONGAP 6 10    Lipids No results for input(s): CHOL, TRIG, HDL, LABVLDL, LDLCALC, CHOLHDL in the last 168 hours.  Hematology Recent Labs  Lab 02/04/24 0027 02/04/24 0612  WBC 8.0 8.6  RBC 4.22 4.22  HGB 14.1 13.9  HCT 42.1 42.2  MCV 99.8 100.0  MCH 33.4 32.9  MCHC 33.5 32.9  RDW 12.6 12.5  PLT 230 213   Thyroid No results for input(s): TSH, FREET4 in the last 168 hours.  BNP Recent Labs  Lab 02/04/24 0027  BNP 297.9*    DDimer No results for input(s): DDIMER in the last 168 hours.   Radiology   DG Chest Portable 1 View Result Date: 02/04/2024 IMPRESSION: 1. Increased interstitial markings in the bases, could be due to interstitial pneumonitis or edema. 2. Small pleural effusions. 3. Aortic atherosclerosis. 4. Emphysema. Electronically Signed   By: Francis Quam M.D.   On: 02/04/2024 01:19   Cardiac Studies  02/04/2024 Echo complete 1. Left ventricular ejection fraction, by estimation, is 70  to 75%. The  left ventricle has hyperdynamic function. The left ventricle has no  regional wall motion abnormalities. There is moderate left ventricular  hypertrophy. Left ventricular diastolic  parameters are consistent with Grade I diastolic dysfunction (impaired  relaxation). LV hypertrophy is more pronounced in the septum with evidence  of significant LVOT gradient ( Peak systolic gradient of 64 mm Hg at  rest).   2. Right ventricular systolic function is normal. The right ventricular  size is normal.   3. The mitral valve is normal in structure. Mild mitral valve  regurgitation. No evidence of mitral stenosis.   4. The aortic valve is normal in structure. Aortic valve regurgitation is  not visualized. No aortic stenosis is present.   5. The inferior vena cava is normal in size with  greater than 50%  respiratory variability, suggesting right atrial pressure of 3 mmHg.   Patient Profile   68 y.o. female with a hx of mild nonobstructive CAD, HOCM, hypertension, hyperlipidemia, COPD, prediabetes, tobacco use, and cocaine use who was admitted 8/25 with acute hypoxic respiratory failure and is being seen for the management of acute on chronic HFpEF.   Assessment & Plan   Acute hypoxic respiratory failure COPD exacerbation - Suspect respiratory failure largely driven by COPD exacerbation, less so acute CHF - Remains on 2 L supplemental O2 - Ongoing management per IM  Acute on chronic HFpEF HOCM - Echo this admission with preserved EF and moderate LV outlet obstruction - BNP 297 - S/p IV Lasix  40 mg x 2 - Appears euvolemic on exam - Lasix  discontinued to prevent volume depletion in the setting of HOCM - She is continued on losartan  and metoprolol  succinate  - Ongoing cocaine use which is detrimental to her condition, counseled on cessation  Polysubstance abuse - Ongoing tobacco and cocaine use - Recommend cessation  Hypertension - BP stable - Continue losartan  and metoprolol   Hyperlipidemia - Continued on statin therapy  For questions or updates, please contact [facility] Please consult [email or web address] for contact info under     Signed, Lesley LITTIE Maffucci, PA-C  02/05/2024, 9:08 AM

## 2024-02-05 NOTE — Plan of Care (Signed)
  Problem: Education: Goal: Knowledge of General Education information will improve Description: Including pain rating scale, medication(s)/side effects and non-pharmacologic comfort measures 02/05/2024 0218 by Lonia Greig FALCON, RN Outcome: Progressing 02/05/2024 0216 by Lonia Greig FALCON, RN Outcome: Progressing   Problem: Health Behavior/Discharge Planning: Goal: Ability to manage health-related needs will improve Outcome: Progressing   Problem: Clinical Measurements: Goal: Respiratory complications will improve Outcome: Progressing   Problem: Nutrition: Goal: Adequate nutrition will be maintained 02/05/2024 0218 by Lonia Greig FALCON, RN Outcome: Progressing 02/05/2024 0216 by Lonia Greig FALCON, RN Outcome: Progressing   Problem: Education: Goal: Ability to demonstrate management of disease process will improve 02/05/2024 0218 by Lonia Greig FALCON, RN Outcome: Progressing 02/05/2024 0216 by Lonia Greig FALCON, RN Outcome: Progressing Goal: Ability to verbalize understanding of medication therapies will improve Outcome: Progressing   Problem: Education: Goal: Ability to demonstrate management of disease process will improve 02/05/2024 0218 by Lonia Greig FALCON, RN Outcome: Progressing 02/05/2024 0216 by Lonia Greig FALCON, RN Outcome: Progressing   Problem: Education: Goal: Knowledge of disease or condition will improve Outcome: Progressing Goal: Understanding of discharge needs will improve Outcome: Progressing   Problem: Health Behavior/Discharge Planning: Goal: Ability to identify changes in lifestyle to reduce recurrence of condition will improve Outcome: Progressing   Problem: Safety: Goal: Ability to remain free from injury will improve Outcome: Progressing   Problem: Health Behavior/Discharge Planning: Goal: Ability to identify changes in lifestyle to reduce recurrence of condition will improve Outcome: Progressing   Problem: Physical Regulation: Goal: Complications related  to the disease process, condition or treatment will be avoided or minimized Outcome: Progressing
